# Patient Record
Sex: Male | Born: 1955
Health system: Southern US, Community
[De-identification: ages and names within clinical notes are randomized; demographics above are authoritative.]

## PROBLEM LIST (undated history)

## (undated) DIAGNOSIS — M1990 Unspecified osteoarthritis, unspecified site: Secondary | ICD-10-CM

## (undated) DIAGNOSIS — I509 Heart failure, unspecified: Secondary | ICD-10-CM

## (undated) HISTORY — PX: TONSILLECTOMY: SUR1361

## (undated) HISTORY — PX: UPPER GASTROINTESTINAL ENDOSCOPY: SHX188

---

## 2000-01-21 HISTORY — PX: COLONOSCOPY: SHX174

## 2000-08-21 ENCOUNTER — Ambulatory Visit (HOSPITAL_COMMUNITY): Admission: RE | Admit: 2000-08-21 | Discharge: 2000-08-21 | Payer: Self-pay | Admitting: Internal Medicine

## 2000-12-22 ENCOUNTER — Emergency Department (HOSPITAL_COMMUNITY): Admission: EM | Admit: 2000-12-22 | Discharge: 2000-12-22 | Payer: Self-pay | Admitting: Emergency Medicine

## 2000-12-22 ENCOUNTER — Encounter: Payer: Self-pay | Admitting: Emergency Medicine

## 2001-05-26 ENCOUNTER — Emergency Department (HOSPITAL_COMMUNITY): Admission: EM | Admit: 2001-05-26 | Discharge: 2001-05-27 | Payer: Self-pay | Admitting: Emergency Medicine

## 2001-05-27 ENCOUNTER — Encounter: Payer: Self-pay | Admitting: Emergency Medicine

## 2002-12-09 ENCOUNTER — Emergency Department (HOSPITAL_COMMUNITY): Admission: EM | Admit: 2002-12-09 | Discharge: 2002-12-09 | Payer: Self-pay | Admitting: Emergency Medicine

## 2003-08-10 ENCOUNTER — Emergency Department (HOSPITAL_COMMUNITY): Admission: EM | Admit: 2003-08-10 | Discharge: 2003-08-11 | Payer: Self-pay | Admitting: Emergency Medicine

## 2003-12-11 ENCOUNTER — Emergency Department (HOSPITAL_COMMUNITY): Admission: EM | Admit: 2003-12-11 | Discharge: 2003-12-11 | Payer: Self-pay | Admitting: Emergency Medicine

## 2004-11-01 ENCOUNTER — Ambulatory Visit (HOSPITAL_COMMUNITY): Admission: RE | Admit: 2004-11-01 | Discharge: 2004-11-01 | Payer: Self-pay | Admitting: Family Medicine

## 2008-11-20 ENCOUNTER — Ambulatory Visit (HOSPITAL_COMMUNITY): Admission: RE | Admit: 2008-11-20 | Discharge: 2008-11-20 | Payer: Self-pay | Admitting: Family Medicine

## 2009-10-23 ENCOUNTER — Ambulatory Visit (HOSPITAL_COMMUNITY): Admission: RE | Admit: 2009-10-23 | Discharge: 2009-10-23 | Payer: Self-pay | Admitting: Internal Medicine

## 2009-10-26 ENCOUNTER — Ambulatory Visit (HOSPITAL_COMMUNITY)
Admission: RE | Admit: 2009-10-26 | Discharge: 2009-10-26 | Payer: Self-pay | Source: Home / Self Care | Admitting: Internal Medicine

## 2009-11-12 ENCOUNTER — Ambulatory Visit (HOSPITAL_COMMUNITY): Admission: RE | Admit: 2009-11-12 | Discharge: 2009-11-12 | Payer: Self-pay | Admitting: Family Medicine

## 2010-04-25 ENCOUNTER — Other Ambulatory Visit (HOSPITAL_COMMUNITY): Payer: Self-pay | Admitting: Family Medicine

## 2010-04-25 ENCOUNTER — Encounter (HOSPITAL_COMMUNITY): Payer: Self-pay

## 2010-04-25 ENCOUNTER — Ambulatory Visit (HOSPITAL_COMMUNITY)
Admission: RE | Admit: 2010-04-25 | Discharge: 2010-04-25 | Disposition: A | Discharge status: Home / Self Care | Payer: BC Managed Care – PPO | Source: Ambulatory Visit | Attending: Family Medicine | Admitting: Family Medicine

## 2010-04-25 DIAGNOSIS — R059 Cough, unspecified: Secondary | ICD-10-CM | POA: Insufficient documentation

## 2010-04-25 DIAGNOSIS — R05 Cough: Secondary | ICD-10-CM | POA: Insufficient documentation

## 2010-04-25 DIAGNOSIS — R1013 Epigastric pain: Secondary | ICD-10-CM

## 2010-04-25 DIAGNOSIS — R079 Chest pain, unspecified: Secondary | ICD-10-CM

## 2010-04-25 DIAGNOSIS — K219 Gastro-esophageal reflux disease without esophagitis: Secondary | ICD-10-CM

## 2010-04-25 DIAGNOSIS — F17200 Nicotine dependence, unspecified, uncomplicated: Secondary | ICD-10-CM | POA: Insufficient documentation

## 2010-05-06 ENCOUNTER — Ambulatory Visit (INDEPENDENT_AMBULATORY_CARE_PROVIDER_SITE_OTHER): Payer: BC Managed Care – PPO | Admitting: Gastroenterology

## 2010-05-06 ENCOUNTER — Encounter: Payer: Self-pay | Admitting: Gastroenterology

## 2010-05-06 VITALS — BP 124/88 | HR 75 | Temp 98.7°F | Ht 68.0 in | Wt 110.2 lb

## 2010-05-06 DIAGNOSIS — K5900 Constipation, unspecified: Secondary | ICD-10-CM | POA: Insufficient documentation

## 2010-05-06 DIAGNOSIS — A048 Other specified bacterial intestinal infections: Secondary | ICD-10-CM

## 2010-05-06 DIAGNOSIS — K625 Hemorrhage of anus and rectum: Secondary | ICD-10-CM

## 2010-05-06 DIAGNOSIS — K623 Rectal prolapse: Secondary | ICD-10-CM

## 2010-05-06 DIAGNOSIS — R1013 Epigastric pain: Secondary | ICD-10-CM | POA: Insufficient documentation

## 2010-05-06 DIAGNOSIS — R935 Abnormal findings on diagnostic imaging of other abdominal regions, including retroperitoneum: Secondary | ICD-10-CM | POA: Insufficient documentation

## 2010-05-06 MED ORDER — PEG 3350-KCL-NA BICARB-NACL 420 G PO SOLR: ORAL | Status: AC

## 2010-05-06 NOTE — Assessment & Plan Note (Signed)
Complete triple drug therapy. EGD is planned.

## 2010-05-06 NOTE — Assessment & Plan Note (Signed)
An abdominal ultrasound 10/11 which showed 3 mm diameter nonshadowing echogenic focus of proximal tail. F/U CT A/P showed normal pancreas but radiologist recommend f/u abd u/s in six months. Will go ahead and arrange for that as well.

## 2010-05-06 NOTE — Assessment & Plan Note (Signed)
Patient reports trying multiple stool softeners in the past and states his rectal pain persists no matter what the consistency of the stool. Encouraged him to try MiraLax 17 g daily as needed. Colonoscopy is planned in the near future

## 2010-05-06 NOTE — Assessment & Plan Note (Signed)
Recent acute onset epigastric pain. Worse with meals. Some improvement since H. pylori treatment started. No prior EGD. EGD in near future to r/o PUD.  I have discussed the risks, alternatives, benefits with regards to but not limited to the risk of reaction to medication, bleeding, infection, perforation and the patient is agreeable to proceed. Written consent to be obtained.

## 2010-05-06 NOTE — Assessment & Plan Note (Addendum)
Intermittent toilet tissue hematochezia. He gives a history of a "rectal muscle tear" but by history may have a rectal prolapse. Remote colonoscopy more than 10 years ago showed internal hemorrhoids. Evaluate hematochezia, rectal pain with colonoscopy in the near future. I have discussed the risks, alternatives, benefits with regards to but not limited to the risk of reaction to medication, bleeding, infection, perforation and the patient is agreeable to proceed. Written consent to be obtained.

## 2010-05-06 NOTE — Progress Notes (Signed)
Primary Care Physician:  Colette Ribas, MD  Primary Gastroenterologist:  Dr. Roetta Sessions  Chief Complaint  Patient presents with  . Abdominal Pain    PUD, EGD, Colonoscopy    HPI:  Robert Duarte is a 55 y.o. male here for further evaluation of abdominal pain. Seen by Dr. Assunta Found on 04/25/2010 for 1 week history of epigastric pain at that time. He states the pain was so severe he thought he broke a rib. He notes a lot of coughing around that time. He describes the pain as burning and stabbing like. Doubled over with it. Worse with food. Never had this before. H. pylori serologies were positive. He is currently on treatment. No odynophagia. Solid food dysphagia at times. No heartburn. Vomiting couple of times. Torn rectal muscle (?rectal prolapse), seen Dr. Lovell Sheehan, Dr. Malvin Johns, will not do surgery. Brown anal drainage, wears pads. Rectal pain with BMs. BM once weekly. TCS 10 years ago by Dr. Jena Gauss, internal hemorrhoids. No EGD. Some brbpr on toilet tissue.    Current Outpatient Prescriptions  Medication Sig Dispense Refill  . clarithromycin (BIAXIN XL) 500 MG 24 hr tablet Take 1 tablet by mouth Twice daily.      . metroNIDAZOLE (FLAGYL) 500 MG tablet Take 1 tablet by mouth Three times a day.      Marland Kitchen omeprazole (PRILOSEC) 40 MG capsule Take 1 tablet by mouth daily.        Allergies as of 05/06/2010 - Review Complete 05/06/2010  Allergen Reaction Noted  . Penicillins  04/25/2010    Past Medical History  Diagnosis Date  . Emphysema    Past Surgical History  Procedure Date  . Tonsillectomy   . Colonoscopy 2002    internal hemorrhoids      Family History  Problem Relation Age of Onset  . Lung cancer Father     deceased  . Heart disease Mother     heart valve replacement  . Colon cancer Neg Hx   . Liver disease Neg Hx     History   Social History  . Marital Status: Married    Spouse Name: N/A    Number of Children: N/A  . Years of Education: N/A    Occupational History  . [employer]/37 years   Social History Main Topics  . Smoking status: Current Everyday Smoker -- 1.0 packs/day for 29 years    Types: Cigarettes  . Smokeless tobacco: Never Used  . Alcohol Use: No  . Drug Use: No  . Sexually Active: Not Currently      ROS:  General: Negative for anorexia, weight loss, fever, chills, fatigue, weakness. Eyes: Negative for vision changes.  ENT: Negative for hoarseness, difficulty swallowing , nasal congestion. CV: Negative for chest pain, angina, palpitations, dyspnea on exertion, peripheral edema.  Respiratory: Negative for dyspnea at rest, dyspnea on exertion, cough, sputum, wheezing.  GI: See history of present illness. GU:  Negative for dysuria, hematuria, urinary incontinence, urinary frequency, nocturnal urination.  MS: Negative for joint pain, low back pain.  Derm: Negative for rash or itching.  Neuro: Negative for weakness, abnormal sensation, seizure, frequent headaches, memory loss, confusion.  Psych: Negative for anxiety, depression, suicidal ideation, hallucinations.  Endo: Negative for unusual weight change.  Heme: Negative for bruising or bleeding. Allergy: Negative for rash or hives.    Physical Examination:  BP 124/88  Pulse 75  Temp 98.7 F (37.1 C)  Ht 5\' 8"  (1.727 m)  Wt  110 lb 3.2 oz (49.986 kg)  BMI 16.76 kg/m2   General: Well-nourished, well-developed in no acute distress.  Head: Normocephalic, atraumatic.   Eyes: Conjunctiva pink, no icterus. Mouth: Oropharyngeal mucosa moist and pink , no lesions erythema or exudate. Neck: Supple without thyromegaly, masses, or lymphadenopathy.  Lungs: Clear to auscultation bilaterally.  Heart: Regular rate and rhythm, no murmurs rubs or gallops.  Abdomen: Bowel sounds are normal, mild epigastric tenderness, nondistended, no hepatosplenomegaly or masses, no abdominal bruits or    hernia , no rebound or guarding.   Extremities: No  lower extremity edema.  Neuro: Alert and oriented x 4 , grossly normal neurologically.  Skin: Warm and dry, no rash or jaundice.   Psych: Alert and cooperative, normal mood and affect.

## 2010-05-09 ENCOUNTER — Ambulatory Visit (HOSPITAL_COMMUNITY)
Admission: RE | Admit: 2010-05-09 | Discharge: 2010-05-09 | Disposition: A | Discharge status: Home / Self Care | Payer: BC Managed Care – PPO | Source: Ambulatory Visit | Attending: Gastroenterology | Admitting: Gastroenterology

## 2010-05-09 DIAGNOSIS — R935 Abnormal findings on diagnostic imaging of other abdominal regions, including retroperitoneum: Secondary | ICD-10-CM

## 2010-05-09 DIAGNOSIS — K869 Disease of pancreas, unspecified: Secondary | ICD-10-CM | POA: Insufficient documentation

## 2010-05-09 DIAGNOSIS — R1013 Epigastric pain: Secondary | ICD-10-CM

## 2010-05-27 ENCOUNTER — Ambulatory Visit (HOSPITAL_COMMUNITY)
Admission: RE | Admit: 2010-05-27 | Discharge: 2010-05-27 | Disposition: A | Discharge status: Home / Self Care | Payer: BC Managed Care – PPO | Source: Ambulatory Visit | Attending: Internal Medicine | Admitting: Internal Medicine

## 2010-05-27 ENCOUNTER — Encounter: Payer: BC Managed Care – PPO | Admitting: Internal Medicine

## 2010-05-27 ENCOUNTER — Other Ambulatory Visit: Payer: Self-pay | Admitting: Internal Medicine

## 2010-05-27 DIAGNOSIS — D129 Benign neoplasm of anus and anal canal: Secondary | ICD-10-CM | POA: Insufficient documentation

## 2010-05-27 DIAGNOSIS — D128 Benign neoplasm of rectum: Secondary | ICD-10-CM | POA: Insufficient documentation

## 2010-05-27 DIAGNOSIS — R1310 Dysphagia, unspecified: Secondary | ICD-10-CM

## 2010-05-27 DIAGNOSIS — K222 Esophageal obstruction: Secondary | ICD-10-CM | POA: Insufficient documentation

## 2010-05-27 DIAGNOSIS — J449 Chronic obstructive pulmonary disease, unspecified: Secondary | ICD-10-CM | POA: Insufficient documentation

## 2010-05-27 DIAGNOSIS — K210 Gastro-esophageal reflux disease with esophagitis: Secondary | ICD-10-CM

## 2010-05-27 DIAGNOSIS — R1013 Epigastric pain: Secondary | ICD-10-CM

## 2010-05-27 DIAGNOSIS — K921 Melena: Secondary | ICD-10-CM

## 2010-05-27 DIAGNOSIS — R17 Unspecified jaundice: Secondary | ICD-10-CM | POA: Insufficient documentation

## 2010-05-27 DIAGNOSIS — J4489 Other specified chronic obstructive pulmonary disease: Secondary | ICD-10-CM | POA: Insufficient documentation

## 2010-05-27 DIAGNOSIS — K5909 Other constipation: Secondary | ICD-10-CM | POA: Insufficient documentation

## 2010-05-28 NOTE — Progress Notes (Signed)
Reminder in epic for repeat abd ultrasound in 6 months

## 2010-05-28 NOTE — Op Note (Signed)
NAME:  Robert Duarte, SANGUINETTI             ACCOUNT NO.:  [id number]  MEDICAL RECORD NO.:  [id number]           PATIENT TYPE:  O  LOCATION:  [facility]                          FACILITY:  [facility]  PHYSICIAN:  R. Roetta Sessions, M.D. DATE OF BIRTH:  October 11, 1955  DATE OF PROCEDURE:  05/27/2010 DATE OF DISCHARGE:                              OPERATIVE REPORT   PROCEDURE:  EGD with Elease Hashimoto dilation followed by ileocolonoscopy with biopsy.  INDICATIONS FOR PROCEDURE:  A 55 year old gentleman with 1-week history of epigastric pain in background some reflux symptoms, intermittent esophageal dysphagia to solids, saw Dr. Phillips Odor.  H. pylori serologies were positive in the initial workup.  He is completing H. pylori therapy with recurrent therapy at this time.  He was seen in office on May 06, 2010.  Since the office visit, he tells me he is much better, he continues to have esophageal dysphagia, but he has no abdominal pain. He is now being jaundiced.  No melena, hematochezia, nausea, or vomiting.  He does have intermittent hematochezia.  Last colonoscopy was 10 years ago, chronic constipation.  This colonoscopy now being done.  Risks, benefits, limitations, alternatives, imponderables have been discussed, questions answered. Please see the documentation in the medical record.  PROCEDURE NOTE:  O2 saturation, blood pressure, pulse, respirations were monitored throughout the entirety of both procedures.  CONSCIOUS SEDATION:  Versed 4 mg IV, Demerol 75 mg IV in divided doses.  INSTRUMENT:  Pentax video chip system.  Cetacaine spray topical pharyngeal anesthesia.  EGD FINDINGS:  Examination of the tubular esophagus revealed a noncritical-appearing Schatzki's ring and tiny circumferential esophageal erosions straddling the GE junction.  There was no Barrett's esophagus or tumor.  EG junction was easily traversed.  Stomach:  Gastric cavity was emptied and insufflated well with air. Thorough examination  of gastric mucosa including retroflexion of proximal stomach, esophagogastric junction demonstrated a small hiatal hernia only.  Pylorus was patent, easily traversed.  Examination of the bulb and second portion revealed no abnormalities.  THERAPEUTIC/DIAGNOSTIC MANEUVERS PERFORMED:  Scope was withdrawn.  A 54- French Maloney dilator was passed to full insertion with ease.  A look back revealed superficial tear at the GE junction, nice dilation of the ring without apparent complication.  The patient tolerated the procedure well, was prepared for colonoscopy.  Digital rectal exam revealed mucosal prolapse approximately 1-1.5 cm circumferential, easily reducible, otherwise negative.  Endoscopic findings:  Prep was excellent.  Colon:  Colonic mucosa was surveyed from the rectosigmoid junction through the left transverse right colon to the appendiceal orifice, ileocecal valve/cecum.  These structures were well seen and photographed for record.  From this level, scope was slowly withdrawn. All previously mentioned mucosal surfaces were again seen.  After the terminal ileum was intubated to 10 cm, the colonic mucosa appeared normal except for diminutive rectosigmoid polyp which was cold biopsied/removed.  Terminal ileal mucosa appeared normal.  Scope was pulled down to the rectum where a thorough examination of the rectal mucosa including retroflexed view of the anal verge demonstrated some redundant rectal tissue bivalving through the anal canal, did not see any significant hemorrhoids.  No evidence of neoplasm  or polyp.  The patient tolerated both procedures well.  Cecal withdrawal time 7 minutes.  IMPRESSION: 1. Circumferential distal esophageal erosions consistent with mild     erosive reflux esophagitis. 2. Schatzki's ring status post dilation as described above, small     hiatal hernia, otherwise normal stomach, D1 and D2.  COLONOSCOPY FINDINGS:  Rectal prolapse as described above.  A  single diminutive rectosigmoid polyp status post cold biopsy removal, otherwise remainder of the rectum, colon, terminal ileum appeared to be normal.  RECOMMENDATIONS: 1. Once H. pylori treatment is completed, continue omeprazole 40 mg     orally daily thereafter.  Literature on reflux and hiatal hernia     provided to the patient. 2. A 10-day course of Canasa, 1 g mesalamine suppositories one per     rectum at bedtime. 3. Daily fiber such as Metamucil or Citrucel. 4. MiraLax 17 g orally bedtime p.r.n. constipation. 5. Further recommendations to follow, pending review on path.     Jonathon Bellows, M.D.     RMR/MEDQ  D:  05/27/2010  T:  05/27/2010  Job:  [id number]  cc:   Dr. Phillips Odor  Electronically Signed by Lorrin Goodell M.D. on 05/28/2010 10:53:24 AM

## 2010-05-29 ENCOUNTER — Encounter: Payer: Self-pay | Admitting: Internal Medicine

## 2010-05-30 ENCOUNTER — Encounter: Payer: Self-pay | Admitting: Internal Medicine

## 2010-06-09 ENCOUNTER — Emergency Department (HOSPITAL_COMMUNITY)
Admission: EM | Admit: 2010-06-09 | Discharge: 2010-06-09 | Disposition: A | Discharge status: Home / Self Care | Payer: BC Managed Care – PPO | Attending: Emergency Medicine | Admitting: Emergency Medicine

## 2010-06-09 ENCOUNTER — Emergency Department (HOSPITAL_COMMUNITY): Payer: BC Managed Care – PPO

## 2010-06-09 DIAGNOSIS — J449 Chronic obstructive pulmonary disease, unspecified: Secondary | ICD-10-CM | POA: Insufficient documentation

## 2010-06-09 DIAGNOSIS — J4489 Other specified chronic obstructive pulmonary disease: Secondary | ICD-10-CM | POA: Insufficient documentation

## 2010-06-09 DIAGNOSIS — R0789 Other chest pain: Secondary | ICD-10-CM | POA: Insufficient documentation

## 2010-06-09 DIAGNOSIS — M25519 Pain in unspecified shoulder: Secondary | ICD-10-CM | POA: Insufficient documentation

## 2010-06-09 LAB — BASIC METABOLIC PANEL
BUN: 10 mg/dL (ref 6–23)
CO2: 29 mEq/L (ref 19–32)
Calcium: 9.2 mg/dL (ref 8.4–10.5)
Chloride: 102 mEq/L (ref 96–112)
Creatinine, Ser: 0.9 mg/dL (ref 0.4–1.5)
GFR calc Af Amer: >60 mL/min (ref >60)
GFR calc non Af Amer: >60 mL/min (ref >60)
Glucose, Bld: 109 mg/dL — ABNORMAL HIGH (ref 70–99)
Potassium: 3.7 mEq/L (ref 3.5–5.1)
Sodium: 136 mEq/L (ref 135–145)

## 2010-06-09 LAB — DIFFERENTIAL
Basophils Absolute: 0.1 10*3/uL (ref 0.0–0.1)
Basophils Relative: 1 % (ref 0–1)
Eosinophils Absolute: 0.1 10*3/uL (ref 0.0–0.7)
Eosinophils Relative: 1 % (ref 0–5)
Lymphocytes Relative: 17 % (ref 12–46)
Lymphs Abs: 1.3 10*3/uL (ref 0.7–4.0)
Monocytes Absolute: 0.7 10*3/uL (ref 0.1–1.0)
Monocytes Relative: 9 % (ref 3–12)
Neutro Abs: 5.5 10*3/uL (ref 1.7–7.7)
Neutrophils Relative %: 72 % (ref 43–77)

## 2010-06-09 LAB — POCT CARDIAC MARKERS
CKMB, poc: <1 ng/mL — ABNORMAL LOW (ref 1.0–8.0)
CKMB, poc: <1 ng/mL — ABNORMAL LOW (ref 1.0–8.0)
Myoglobin, poc: 44.8 ng/mL (ref 12–200)
Myoglobin, poc: 49 ng/mL (ref 12–200)
Troponin i, poc: <0.05 ng/mL (ref 0.00–0.09)
Troponin i, poc: <0.05 ng/mL (ref 0.00–0.09)

## 2010-06-09 LAB — CBC
HCT: 38.5 % — ABNORMAL LOW (ref 39.0–52.0)
Hemoglobin: 13 g/dL (ref 13.0–17.0)
MCH: 30.4 pg (ref 26.0–34.0)
MCHC: 33.8 g/dL (ref 30.0–36.0)
MCV: 90.2 fL (ref 78.0–100.0)
Platelets: 121 10*3/uL — ABNORMAL LOW (ref 150–400)
RBC: 4.27 MIL/uL (ref 4.22–5.81)
RDW: 13.1 % (ref 11.5–15.5)
WBC: 7.6 10*3/uL (ref 4.0–10.5)

## 2010-09-22 ENCOUNTER — Emergency Department (HOSPITAL_COMMUNITY): Payer: BC Managed Care – PPO

## 2010-09-22 ENCOUNTER — Emergency Department (HOSPITAL_COMMUNITY)
Admission: EM | Admit: 2010-09-22 | Discharge: 2010-09-22 | Disposition: A | Discharge status: Home / Self Care | Payer: BC Managed Care – PPO | Attending: Emergency Medicine | Admitting: Emergency Medicine

## 2010-09-22 ENCOUNTER — Encounter (HOSPITAL_COMMUNITY): Payer: Self-pay | Admitting: *Deleted

## 2010-09-22 DIAGNOSIS — F17200 Nicotine dependence, unspecified, uncomplicated: Secondary | ICD-10-CM | POA: Insufficient documentation

## 2010-09-22 DIAGNOSIS — M67919 Unspecified disorder of synovium and tendon, unspecified shoulder: Secondary | ICD-10-CM

## 2010-09-22 DIAGNOSIS — J438 Other emphysema: Secondary | ICD-10-CM | POA: Insufficient documentation

## 2010-09-22 DIAGNOSIS — M25519 Pain in unspecified shoulder: Secondary | ICD-10-CM | POA: Insufficient documentation

## 2010-09-22 MED ORDER — HYDROCODONE-ACETAMINOPHEN 5-325 MG PO TABS: 1.0000 | ORAL_TABLET | ORAL | Status: AC | PRN

## 2010-09-22 MED ORDER — HYDROCODONE-ACETAMINOPHEN 5-325 MG PO TABS
1.0000 | ORAL_TABLET | Freq: Once | ORAL | Status: AC
  Administered 2010-09-22: 1 via ORAL
  Filled 2010-09-22: qty 1

## 2010-09-22 MED ORDER — IBUPROFEN 800 MG PO TABS
800.0000 mg | ORAL_TABLET | Freq: Once | ORAL | Status: AC
  Administered 2010-09-22: 800 mg via ORAL
  Filled 2010-09-22: qty 1

## 2010-09-22 MED ORDER — IBUPROFEN 800 MG PO TABS: 800.0000 mg | ORAL_TABLET | Freq: Three times a day (TID) | ORAL | Status: AC | PRN

## 2010-09-22 NOTE — ED Provider Notes (Signed)
History     CSN: [id number] Arrival date & time: 09/22/2010 10:41 AM  Chief Complaint  Patient presents with  . Shoulder Pain   HPI Comments: Patient works a job that required repetitive movements and lifting with both arms.  He denies injury.  He does have a history of left rotator cuff injury years ago, which this remind him of the symptoms.  Patient is a 55 y.o. male presenting with shoulder pain. The history is provided by the patient.  Shoulder Pain This is a new problem. The current episode started 1 to 4 weeks ago. The problem occurs constantly. The problem has been gradually worsening. Associated symptoms include arthralgias. Pertinent negatives include no abdominal pain, chest pain, chills, congestion, diaphoresis, fever, headaches, joint swelling, nausea, neck pain, numbness, rash, sore throat or weakness. Exacerbated by: Worse with certain positions and with palpation,  lifting. He has tried ice for the symptoms. The treatment provided no relief.    Past Medical History  Diagnosis Date  . Emphysema     Past Surgical History  Procedure Date  . Tonsillectomy   . Colonoscopy 2002    internal hemorrhoids    Family History  Problem Relation Age of Onset  . Lung cancer Father     deceased  . Heart disease Mother     heart valve replacement  . Colon cancer Neg Hx   . Liver disease Neg Hx     History  Substance Use Topics  . Smoking status: Current Everyday Smoker -- 1.0 packs/day for 29 years    Types: Cigarettes  . Smokeless tobacco: Never Used  . Alcohol Use: No      Review of Systems  Constitutional: Negative for fever, chills and diaphoresis.  HENT: Negative for congestion, sore throat and neck pain.   Eyes: Negative.   Respiratory: Negative for chest tightness and shortness of breath.   Cardiovascular: Negative for chest pain and leg swelling.  Gastrointestinal: Negative for nausea and abdominal pain.  Genitourinary: Negative.   Musculoskeletal: Positive  for arthralgias. Negative for joint swelling.  Skin: Negative.  Negative for rash and wound.  Neurological: Negative for dizziness, weakness, light-headedness, numbness and headaches.  Hematological: Negative.   Psychiatric/Behavioral: Negative.     Physical Exam  BP 131/82  Pulse 62  Temp(Src) 97.9 F (36.6 C) (Oral)  Resp 16  Ht 5\' 8"  (1.727 m)  Wt 110 lb (49.896 kg)  BMI 16.73 kg/m2  SpO2 99%  Physical Exam  Nursing note and vitals reviewed. Constitutional: He is oriented to person, place, and time. He appears well-developed and well-nourished.  HENT:  Head: Normocephalic and atraumatic.  Eyes: Conjunctivae are normal.  Neck: Normal range of motion.  Cardiovascular: Normal rate, regular rhythm, normal heart sounds and intact distal pulses.   Pulmonary/Chest: Effort normal and breath sounds normal. He has no wheezes.  Abdominal: Soft. Bowel sounds are normal. There is no tenderness.  Musculoskeletal: He exhibits tenderness. He exhibits no edema.       Right shoulder: He exhibits tenderness, bony tenderness and pain. He exhibits no swelling, no effusion and no crepitus.       Arms:      Pain worsens with attempts to lift arm over shoulder height and with attempts to abduct his outstretched right arm.  Equal grip strength.  Radial pulses equal bilateral,  No decreased sensation distal to site of pain.  Neurological: He is alert and oriented to person, place, and time.  Skin: Skin is warm and dry.  Psychiatric: He has a normal mood and affect.    ED Course  Procedures  MDM Exam most consistent with rotator cuff injury/sprain/strain.   Dg Shoulder Right  09/22/2010  *RADIOLOGY REPORT*  Clinical Data: Right shoulder pain.  RIGHT SHOULDER - 2+ VIEW  Comparison: None  Findings: There is no evidence of acute bony abnormality. There is no evidence of acute fracture, subluxation, or dislocation. No focal bony lesions are identified. The visualized right hemithorax is unremarkable.   IMPRESSION: Unremarkable shoulder.  Original Report Authenticated By: Rosendo Gros, M.D.          Candis Musa, PA 09/22/10 1659

## 2010-09-22 NOTE — ED Notes (Signed)
Pt c/o pain in his right shoulder x 1 week. Pt denies injury.

## 2010-10-01 NOTE — ED Provider Notes (Signed)
Medical screening examination/treatment/procedure(s) were performed by non-physician practitioner and as supervising physician I was immediately available for consultation/collaboration.   Klint Lezcano W. Anel Creighton, MD 10/01/10 0538 

## 2010-10-03 ENCOUNTER — Ambulatory Visit (INDEPENDENT_AMBULATORY_CARE_PROVIDER_SITE_OTHER): Payer: BC Managed Care – PPO | Admitting: Orthopedic Surgery

## 2010-10-03 ENCOUNTER — Encounter: Payer: Self-pay | Admitting: Orthopedic Surgery

## 2010-10-03 VITALS — BP 116/80 | Ht 68.0 in | Wt 110.0 lb

## 2010-10-03 DIAGNOSIS — M719 Bursopathy, unspecified: Secondary | ICD-10-CM

## 2010-10-03 DIAGNOSIS — M7551 Bursitis of right shoulder: Secondary | ICD-10-CM

## 2010-10-03 MED ORDER — HYDROCODONE-ACETAMINOPHEN 5-325 MG PO TABS: 1.0000 | ORAL_TABLET | Freq: Four times a day (QID) | ORAL | Status: AC | PRN

## 2010-10-03 MED ORDER — IBUPROFEN 800 MG PO TABS: 800.0000 mg | ORAL_TABLET | Freq: Three times a day (TID) | ORAL | Status: AC | PRN

## 2010-10-03 MED ORDER — METHYLPREDNISOLONE ACETATE 40 MG/ML IJ SUSP: 40.0000 mg | Freq: Once | INTRAMUSCULAR | Status: DC

## 2010-10-03 NOTE — Progress Notes (Signed)
Chief complaint: RIGHT shoulder stiffness HPI:(78) 55 year old male complains of inability to raise his arm high.  He has pain over the RIGHT shoulder.  The pain started a few months ago.  The pain started suddenly with no injury.  No previous treatment.  He did go to the emergency room x-rays were negative.  Symptoms include throbbing stabbing burning pain which is 10 out of 10. Pain is constant, all the time. Nothing is made better Lifting and pulling makes it worse Associated symptoms swelling  ROS:(2) Review of systems fatigue, shortness of breath and cough.  Joint pain and stiffness.  Denies numbness tingling.  PFSH: (1)  Past Medical History  Diagnosis Date  . Emphysema    Past Surgical History  Procedure Date  . Tonsillectomy   . Colonoscopy 2002    internal hemorrhoids     Physical Exam(12) GENERAL: normal development   CDV: pulses are normal   Skin: normal  Lymph: nodes were not palpable/normal  Psychiatric: awake, alert and oriented  Neuro: normal sensation  MSK Ambulation is normal  LEFT shoulder no tenderness or swelling, range of motion is normal.  Strength is grade 5.  Shoulder is stable.  RIGHT shoulder is tender at the anterolateral acromial edge including the deltoid.  His external rotation is limited to 10 passive and active.  His abduction is limited to 70 active and passive.  Forward elevation 80 active and passive.  Internal and external rotation strength were grade 5.  Inferior subluxation test was negative for instability.  Imaging: Hospital x-rays were reviewed with the report were negative he has a type II acromion  Assessment: Rotator cuff bursitis which led to adhesive capsulitis    Plan: Subacromial injection, physical therapy, anti-inflammatories, and Norco for pain.  The patient is continuing to work through this.  Come back in 3 months.  Shoulder Injection Procedure Note   Pre-operative Diagnosis: right  RC  Syndrome  Post-operative Diagnosis: same  Indications: pain   Anesthesia: ethyl chloride   Procedure Details   Verbal consent was obtained for the procedure. The shoulder was prepped withalcohol and the skin was anesthetized. A 20 gauge needle was advanced into the subacromial space through posterior approach without difficulty  The space was then injected with 3 ml 1% lidocaine and 1 ml of depomedrol. The injection site was cleansed with isopropyl alcohol and a dressing was applied.  Complications:  None; patient tolerated the procedure well.        Shoulder Injection Procedure Note   Pre-operative Diagnosis: right  RC Syndrome  Post-operative Diagnosis: same  Indications: pain   Anesthesia: ethyl chloride   Procedure Details   Verbal consent was obtained for the procedure. The shoulder was prepped withalcohol and the skin was anesthetized. A 20 gauge needle was advanced into the subacromial space through posterior approach without difficulty  The space was then injected with 3 ml 1% lidocaine and 1 ml of depomedrol. The injection site was cleansed with isopropyl alcohol and a dressing was applied.  Complications:  None; patient tolerated the procedure well.

## 2010-10-03 NOTE — Patient Instructions (Addendum)
You have received a steroid shot. 15% of patients experience increased pain at the injection site with in the next 24 hours. This is best treated with ice and tylenol extra strength 2 tabs every 8 hours. If you are still having pain please call the office.   Take ibuprofen 3 x a day ( script sent to pharmacy)  Take norco q 6 hrs as needed for pain  Start physical therapy Adhesive Capsulitis Sometimes the shoulder becomes stiff and is painful to move. Some people say it feels as if the shoulder is frozen in place. Because of this, the condition is called "frozen shoulder." Its medical name is adhesive capsulitis.   The shoulder joint is made up of strong connective tissue that attaches the ball of the humerus to the shallow shoulder socket. This strong connective tissue is called the joint capsule. This tissue can become stiff and swollen. That is when adhesive capsulitis sets in. CAUSES It is not always clear just what the cause adhesive capsulitis. Possibilities include:  Injury to the shoulder joint.   Strain. This is a repetitive injury brought about by overuse.   Lack of use. Perhaps your arm or hand was otherwise injured. It might have been in a sling for awhile. Or perhaps you were not using it to avoid pain.   Referred pain. This is a sort of trick the body plays. You feel pain in the shoulder. But, the pain actually comes from an injury somewhere else in the body.   Long-standing health problems. Several diseases can cause adhesive capsulitis. They include diabetes, heart disease, stroke, thyroid problems, rheumatoid arthritis and lung disease.   Being a women older than 40. Anyone can develop adhesive capsulitis but it is most common in women in this age group.  SYMPTOMS  Pain.   It occurs when the arm is moved.   Parts of the shoulder might hurt if they are touched.   Pain is worse at night or when resting.   Soreness. It might not be strong enough to be called pain. But, the  shoulder aches.   The shoulder does not move freely.   Muscle spasms.   Trouble sleeping because of shoulder ache or pain.  DIAGNOSIS To decide if you have adhesive capsulitis, your healthcare provider will probably:  Ask about symptoms you have noticed.   Ask about your history of joint pain and anything that might have caused the pain.   Ask about your overall health.   Use hands to feel your shoulder and neck.   Ask you to move your shoulder in specific directions. This may indicate the origin of the pain.   Order imaging tests; pictures of the shoulder. They help pinpoint the source of the problem. An X-ray might be used. For more detail, an MRI is often used. An MRI details the tendons, muscles and ligaments as well as the joint.  TREATMENT Adhesive capsulitis can be treated several ways. Most treatments can be done in a clinic or in your healthcare provider's office. Be sure to discuss the different options with your caregiver. They include:  Physical therapy. You will work on specific exercises to get your shoulder moving again. The exercises usually involve stretching. A physical therapist (a caregiver with special training) can show you what to do and what not to do. The exercises will need to be done daily.   Medication.   Over-the-counter medicines may relieve pain and inflammation (the body's way of reacting to injury or infection).  Corticosteroids. These are stronger drugs to reduce pain and inflammation. They are given by injection (shots) into the shoulder joint. Frequent treatment is not recommended.   Muscle relaxants. Medication may be prescribed to ease muscle spasms.   Treatment of underlying conditions. This means treating another condition that is causing your shoulder problem. This might be a rotator cuff (tendon) problem   Shoulder manipulation. The shoulder will be moved by your healthcare provider. You would be under general anesthesia (given a drug  that puts you to sleep). You would not feel anything. Sometimes the joint will be injected with salt water (saline) at high pressure to break down internal scarring in the joint capsule.   Surgery. This is rarely needed. It may be suggested in advanced cases after all other treatment has failed.  PROGNOSIS In time, most people recover from adhesive capsulitis. Sometimes, however, the pain goes away but full movement of the shoulder does not return.   HOME CARE INSTRUCTIONS  Take any pain medications recommended by your healthcare provider. Follow the directions carefully.   If you have physical therapy, follow through with the therapist's suggestions. Be sure you understand the exercises you will be doing. You should understand:   How often the exercises should be done.   How many times each exercise should be repeated.   How long they should be done.   What other activities you should do, or not do.   That you should warm up before doing any exercise. Just 5 to 10 minutes will help. Small, gentle movements should get your shoulder ready for more.   Avoid high-demand exercise that involves your shoulder such as throwing. This type of exercise can make pain worse.   Consider using cold packs. Cold may ease swelling and pain. Ask your healthcare provider if a cold pack might help you. If so, get directions on how and when to use them.  SEEK MEDICAL CARE IF:  You have any questions about your medications.   Your pain continues to increase.  Document Released: 11/03/2008   [facility] Patient Information 2011 [place], [place].Impingement Syndrome   (Rotator Cuff Tendinitis, Bursitis) with Rehab   Impingement syndrome is a condition that involves inflammation of the tendons of the rotator cuff and the subacromial bursa, that causes pain in the shoulder. The rotator cuff consists of four tendons and muscles that control much of the shoulder and upper arm function. The subacromial bursa is a  fluid filled sac that helps reduce friction between the rotator cuff and one of the bones of the shoulder (acromion). Impingement syndrome is usually an overuse injury that causes  swelling of the bursa (bursitis), swelling of the tendon (tendonitis), and/or a tear of the tendon (strain). Strains are classified into three categories. Grade 1 strains cause pain, but the tendon is not lengthened. Grade 2 strains include a lengthened ligament, due to the ligament being stretched or partially ruptured. With grade 2 strains there is still function, although the function may be decreased. Grade 3 strains include a complete tear of the tendon or muscle, and function is usually impaired.   SYMPTOMS  Pain around the shoulder, often at the outer portion of the upper arm.  Pain that gets worse with shoulder function, especially when reaching overhead or lifting.  Sometimes, aching when not using the arm.  Pain that wakes you up at night.  Sometimes, tenderness, swelling, warmth, or redness over the affected area.  Loss of strength.  Limited motion of the shoulder, especially  reaching behind the back (to the back pocket or to unhook bra) or across your body.  Crackling sound (crepitation) when moving the arm.  Biceps tendon pain and inflammation (in the front of the shoulder). Worse when bending the elbow or lifting.   CAUSES   Impingement syndrome is often an overuse injury, in which chronic (repetitive) motions cause the tendons or bursa to become inflamed.  A strain occurs when a force is paced on the tendon or muscle that is greater than it can withstand. Common mechanisms of injury include: Stress from sudden increase in duration, frequency, or intensity of training.  Direct hit (trauma) to the shoulder.  Aging, erosion of the tendon with normal use.  Bony bump on shoulder (acromial spur).   RISK INCREASES WITH    Contact sports (football, wrestling, boxing).  Throwing sports (baseball,  tennis, volleyball).  Weightlifting and bodybuilding.  Heavy labor.  Previous injury to the rotator cuff, including impingement.  Poor shoulder strength and flexibility.  Failure to warm up properly before activity.  Inadequate protective equipment.  Old age.  Bony bump on shoulder (acromial spur).   PREVENTIVE MEASURES    Warm up and stretch properly before activity.  Allow for adequate recovery between workouts.  Maintain physical fitness: l Strength, flexibility, and endurance. l Cardiovascular fitness.  Learn and use proper exercise technique.   PROGNOSIS If treated properly, impingement syndrome usually goes away within 6 weeks. Sometimes surgery is required.     POSSIBLE COMPLICATIONS    Longer healing time if not properly treated, or if not given enough time to heal.  Recurring symptoms, that result in a chronic condition.  Shoulder stiffness, frozen shoulder, or loss of motion.  Rotator cuff tendon tear.  Recurring symptoms, especially if activity is resumed too soon, with overuse, with a direct blow, or when using poor technique.   GENERAL TREATMENT CONSIDERATIONS   Treatment first involves the use of ice and medicine, to reduce pain and inflammation. The use of strengthening and stretching exercises may help reduce pain with activity. These exercises may be performed at home or with a therapist. If non-surgical treatment is unsuccessful after more than 6 months, surgery may be advised. After surgery and rehabilitation, activity is usually possible in 3 months.     MEDICATION:    If pain medicine is needed, nonsteroidal anti-inflammatory medicines (aspirin and ibuprofen), or other minor pain relievers (acetaminophen), are often advised.    Do not take pain medicine for 7 days before surgery.    Prescription pain relievers may be given, if your caregiver thinks they are needed. Use only as directed and only as much as you need.  Corticosteroid  injections may be given by your caregiver.  These injections should be reserved for the most serious cases, because they may only be given a certain number of times.   HEAT AND COLD:    Cold treatment (icing) should be applied for 10 to 15 minutes every 2 to 3 hours for inflammation and pain, and immediately after activity that aggravates your symptoms. Use ice packs or an ice massage.  Heat treatment may be used before performing stretching and strengthening activities prescribed by your caregiver, physical therapist, or athletic trainer. Use a heat pack or a warm water soak.     SEEK MEDICAL CARE IF:    Symptoms get worse or do not improve in 4 to 6 weeks, despite treatment.  New, unexplained symptoms develop. (Drugs used in treatment may produce side effects.)  EXERCISES   RANGE OF MOTION AND STRETCHING EXERCISES - Impingement Syndrome (Rotator Cuff  Tendinitis, Bursitis) These exercises may help you when beginning to rehabilitate your injury. Your symptoms may go away with or without further involvement from your physician, physical therapist or athletic trainer. While completing these exercises, remember:    Restoring tissue flexibility helps normal motion to return to the joints. This allows healthier, less painful movement and activity.  An effective stretch should be held for at least 30 seconds.  A stretch should never be painful. You should only feel a gentle lengthening or release in the stretched tissue.       STRETCH - Flexion, Standing    Stand with good posture. With an underhand grip on your __________ hand, and an overhand grip on the opposite hand, grasp a broomstick or cane so that your hands are a little more than shoulder width apart.  Keeping your __________ elbow straight and shoulder muscles relaxed, push the stick with your opposite hand, to raise your __________ arm in front of your body and then overhead. Raise your arm until you feel a stretch in your  __________ shoulder, but before you have increased shoulder pain.  Try to avoid shrugging your __________ shoulder as your arm rises, by keeping your shoulder blade tucked down and toward your mid-back spine. Hold for __________ seconds.  Slowly return to the starting position.   Repeat __________ times. Complete this exercise __________ times per day.     STRETCH - Abduction, Supine    Lie on your back. With an underhand grip on your __________ hand and an overhand grip on the opposite hand, grasp a broomstick or cane so that your hands are a little more than shoulder width apart.  Keeping your __________ elbow straight and your shoulder muscles relaxed, push the stick with your opposite hand, to raise your __________ arm out to the side of your body and then overhead. Raise your arm until you feel a stretch in your __________ shoulder, but before you have increased shoulder pain.  Try to avoid shrugging your __________ shoulder as your arm rises, by keeping your shoulder blade tucked down and toward your mid-back spine. Hold for __________ seconds.  Slowly return to the starting position.   Repeat __________ times. Complete this exercise __________ times per day.       ROM - Flexion, Active-Assisted  Lie on your back. You may bend your knees for comfort.  Grasp a broomstick or cane so your hands are about shoulder width apart. Your __________ hand should grip the end of the stick, so that your hand is positioned "thumbs-up," as if you were about to shake hands.  Using your healthy arm to lead, raise your __________ arm overhead, until you feel a gentle stretch in your shoulder. Hold for __________ seconds.    Use the stick to assist in returning your __________ arm to its starting position. Repeat __________ times. Complete this exercise __________ times per day.       ROM - Internal Rotation, Supine     Lie on your back on a firm surface.  Place your __________ elbow about 60  degrees away from your side. Elevate your elbow with a folded towel, so that the elbow and shoulder are the same height.  Using a broomstick or cane and your strong arm, pull your __________ hand toward your body until you feel a gentle stretch, but no increase in your shoulder pain.  Keep your shoulder and  elbow in place throughout the exercise.  Hold for __________ seconds.  Slowly return to the starting position.   Repeat __________ times.  Complete this exercise __________ times per day.     STRETCH - Internal Rotation    Place your __________ hand behind your back, palm up.  Throw a towel or belt over your opposite shoulder. Grasp the towel with your __________ hand.  While keeping an upright posture, gently pull up on the towel, until you feel a stretch in the front of your __________ shoulder.  Avoid shrugging your __________ shoulder as your arm rises, by keeping your shoulder blade tucked down and toward your mid-back spine.    Hold for __________ seconds. Release the stretch, by lowering your healthy hand.   Repeat __________ times. Complete this exercise __________ times per day.       ROM - Internal Rotation     Using an underhand grip, grasp a stick behind your back with both hands.  While standing upright with good posture, slide the stick up your back until you feel a mild stretch in the front of your shoulder.  Hold for __________ seconds.  Slowly return to your starting position. Repeat __________ times. Complete this exercise __________ times per day.       STRETCH - Posterior Shoulder Capsule    Stand or sit with good posture.  Grasp your __________ elbow and draw it across your chest, keeping it at the same height as your shoulder.  Pull your elbow, so your upper arm comes in closer to your chest. Pull until you feel a gentle stretch in the back of your shoulder.  Hold for __________ seconds.   Repeat __________ times. Complete this exercise __________  times per day.     STRENGTHENING EXERCISES - Impingement Syndrome (Rotator Cuff Tendinitis, Bursitis) These exercises may help you when beginning to rehabilitate your injury. They may resolve your symptoms with or without further involvement from your physician, physical therapist or athletic trainer. While completing these exercises, remember:   Muscles can gain both the endurance and the strength needed for everyday activities through controlled exercises.  Complete these exercises as instructed by your physician, physical therapist or athletic trainer. Increase the resistance and repetitions only as guided.  You may experience muscle soreness or fatigue, but the pain or discomfort you are trying to eliminate should never worsen during these exercises. If this pain does get worse, stop and make sure you are following the directions exactly. If the pain is still present after adjustments, discontinue the exercise until you can discuss the trouble with your clinician.  During your recovery, avoid activity or exercises which involve actions that place your injured hand or elbow above your head or behind your back or head. These positions stress the tissues which you are trying to heal.    STRENGTH - Scapular Depression and Adduction   With good posture, sit on a firm chair. Support your arms in front of you, with pillows, arm rests, or on a table top. Have your elbows in line with the sides of your body.  Gently draw your shoulder blades down and toward your mid-back spine. Gradually increase the tension, without tensing the muscles along the top of your shoulders and the back of your neck.  Hold for __________ seconds. Slowly release the tension and relax your muscles completely before starting the next repetition.  After you have practiced this exercise, remove the arm support and complete the exercise in standing as  well as sitting position. Repeat __________ times. Complete this exercise  __________ times per day.       STRENGTH - Shoulder Abductors, Isometric    With good posture, stand or sit about 4-6 inches from a wall, with your __________ side facing the wall.    Bend your __________ elbow. Gently press your __________ elbow into the wall. Increase the pressure gradually, until you are pressing as hard as you can, without shrugging your shoulder or increasing any shoulder discomfort.  Hold for __________ seconds.  Release the tension slowly. Relax your shoulder muscles completely before you begin the next repetition.   Repeat __________ times. Complete this exercise __________ times per day.       STRENGTH - External Rotators, Isometric    Keep your __________ elbow at your side and bend it 90 degrees.  Step into a door frame so that the outside of your __________ wrist can press against the door frame without your upper arm leaving your side.  Gently press your __________ wrist into the door frame, as if you were trying to swing the back of your hand away from your stomach. Gradually increase the tension, until you are pressing as hard as you can, without shrugging your shoulder or increasing any shoulder discomfort.  Hold for __________ seconds.    Release the tension slowly. Relax your shoulder muscles completely before you begin the next repetition.   Repeat __________ times. Complete this exercise __________ times per day.       STRENGTH - Supraspinatus   Stand or sit with good posture. Grasp a __________ lb weight, or an exercise band or tubing, so that your hand is "thumbs-up," like you are shaking hands.  Slowly lift your __________ arm in a "V" away from your thigh, diagonally into the space between your side and straight ahead. Lift your hand to shoulder height or as far as you can, without increasing any shoulder pain.  At first, many people do not lift their hands above shoulder height.    Avoid shrugging your __________ shoulder as your arm rises,  by keeping your shoulder blade tucked down and toward your mid-back spine.    Hold for __________ seconds. Control the descent of your hand, as you slowly return to your starting position.   Repeat __________ times. Complete this exercise __________ times per day.       STRENGTH - External Rotators  Secure a rubber exercise band or tubing to a fixed object (table, pole) so that it is at the same height as your __________ elbow when you are standing or sitting on a firm surface.  Stand or sit so that the secured exercise band is at your uninjured side.    Bend your __________ elbow 90 degrees. Place a folded towel or small pillow under your __________ arm, so that your elbow is a few inches away from your side.  Keeping the tension on the exercise band, pull it away from your body, as if pivoting on your elbow. Be sure to keep your body steady, so that the movement is coming only from your rotating shoulder.  Hold for __________ seconds. Release the tension in a controlled manner, as you return to the starting position.   Repeat __________ times. Complete this exercise __________ times per day.       STRENGTH - Internal Rotators   Secure a rubber exercise band or tubing to a fixed object (table, pole) so that it is at the same height as  your __________ elbow when you are standing or sitting on a firm surface.  Stand or sit so that the secured exercise band is at your __________ side.    Bend your elbow 90 degrees.  Place a folded towel or small pillow under your __________ arm so that your elbow is a few inches away from your side.  Keeping the tension on the exercise band, pull it across your body, toward your stomach. Be sure to keep your body steady, so that the movement is coming only from your rotating shoulder.  Hold for __________ seconds.  Release the tension in a controlled manner, as you return to the starting position.   Repeat __________ times. Complete this exercise  __________ times per day.       STRENGTH - Scapular Protractors, Standing   Stand arms length away from a wall. Place your hands on the wall, keeping your elbows straight.  Begin by dropping your shoulder blades down and toward your mid-back spine.  To strengthen your protractors, keep your shoulder blades down, but slide them forward on your rib cage. It will feel as if you are lifting the back of your rib cage away from the wall. This is a subtle motion and can be challenging to complete. Ask your caregiver for further instruction, if you are not sure you are doing the exercise correctly.  Hold for __________ seconds. Slowly return to the starting position, resting the muscles completely before starting the next repetition. Repeat __________ times. Complete this exercise __________ times per day.     STRENGTH - Scapular Protractors, Supine  Lie on your back on a firm surface. Extend your __________ arm straight into the air while holding a __________ pound weight in your hand.  Keeping your head and back in place, lift your shoulder off the floor.  Hold for __________ seconds. Slowly return to the starting position, and allow your muscles to relax completely before starting the next repetition. Repeat __________ times. Complete this exercise __________ times per day.     STRENGTH - Scapular Protractors, Quadruped  Get onto your hands and knees, with your shoulders directly over your hands (or as close as you can be, comfortably).    Keeping your elbows locked, lift the back of your rib cage up into your shoulder blades, so your mid-back rounds out. Keep your neck muscles relaxed.    Hold this position for __________ seconds. Slowly return to the starting position and allow your muscles to relax completely before starting the next repetition. Repeat __________ times. Complete this exercise __________ times per day.       STRENGTH - Scapular Retractors  Secure a rubber exercise  band or tubing to a fixed object (table, pole), so that it is at the height of your shoulders when you are either standing, or sitting on a firm armless chair.  With a palm down grip, grasp an end of the band in each hand. Straighten your elbows and lift your hands straight in front of you, at shoulder height. Step back, away from the secured end of the band, until it becomes tense.  Squeezing your shoulder blades together, draw your elbows back toward your sides, as you bend them. Keep your upper arms lifted away from your body throughout the exercise.  Hold for __________ seconds. Slowly ease the tension on the band, as you reverse the directions and return to the starting position.   Repeat __________ times. Complete this exercise __________ times per day.  STRENGTH - Shoulder Extensors   Secure a rubber exercise band or tubing to a fixed object (table, pole) so that it is at the height of your shoulders when you are either standing, or sitting on a firm armless chair.  With a thumbs-up grip, grasp an end of the band in each hand. Straighten your elbows and lift your hands straight in front of you, at shoulder height. Step back, away from the secured end of the band, until it becomes tense.  Squeezing your shoulder blades together, pull your hands down to the sides of your thighs. Do not allow your hands to go behind you.    Hold for __________ seconds. Slowly ease the tension on the band, as you reverse the directions and return to the starting position.   Repeat __________ times. Complete this exercise __________ times per day.       STRENGTH - Scapular Retractors and External Rotators     Secure a rubber exercise band or tubing to a fixed object (table, pole) so that it is at the height as your shoulders, when you are either standing, or sitting on a firm armless chair.  With a palm down grip, grasp an end of the band in each hand. Bend your elbows 90 degrees and lift your elbows  to shoulder height, at your sides. Step back, away from the secured end of the band, until it becomes tense.  Squeezing your shoulder blades together, rotate your shoulders so that your upper arms and elbows remain stationary, but your fists travel upward to head height.  Hold for __________ seconds. Slowly ease the tension on the band, as you reverse the directions and return to the starting position.   Repeat __________ times. Complete this exercise __________ times per day.       STRENGTH - Scapular Retractors and External Rotators, Rowing   Secure a rubber exercise band or tubing to a fixed object (table, pole) so that it is at the height of your shoulders, when you are either standing, or sitting on a firm armless chair.  With a palm down grip, grasp an end of the band in each hand. Straighten your elbows and lift your hands straight in front of you, at shoulder height. Step back, away from the secured end of the band, until it becomes tense.  Step 1: Squeeze your shoulder blades together. Bending your elbows, draw your hands to your chest, as if you are rowing a boat. At the end of this motion, your hands and elbow should be at shoulder height and your elbows should be out to your sides.  Step 2: Rotate your shoulders, to raise your hands above your head. Your forearms should be vertical and your upper arms should be horizontal.  Hold for __________ seconds. Slowly ease the tension on the band, as you reverse the directions and return to the starting position.   Repeat __________ times. Complete this exercise __________ times per day.       STRENGTH - Scapular Depressors  Find a sturdy chair without wheels, such as a dining room chair.  Keeping your feet on the floor, and your hands on the chair arms, lift your bottom up from the seat, and lock your elbows.  Keeping your elbows straight, allow gravity to pull your body weight down. Your shoulders will rise toward your ears.  Raise  your body against gravity by drawing your shoulder blades down your back, shortening the distance between your shoulders and ears.  Although  your feet should always maintain contact with the floor, your feet should progressively support less body weight, as you get stronger.    Hold for __________ seconds. In a controlled and slow manner, lower your body weight to begin the next repetition. Repeat __________ times. Complete this exercise __________ times per day.     Document Released: 01/06/2005  Document Re-Released: 11/03/2008 [facility] Patient Information 2011 [place], [place].

## 2010-10-15 ENCOUNTER — Ambulatory Visit (HOSPITAL_COMMUNITY): Payer: BC Managed Care – PPO | Admitting: Specialist

## 2010-10-22 ENCOUNTER — Ambulatory Visit (HOSPITAL_COMMUNITY)
Admission: RE | Admit: 2010-10-22 | Discharge: 2010-10-22 | Disposition: A | Discharge status: Home / Self Care | Payer: BC Managed Care – PPO | Source: Ambulatory Visit | Attending: Orthopedic Surgery | Admitting: Orthopedic Surgery

## 2010-10-22 ENCOUNTER — Telehealth: Payer: Self-pay | Admitting: Internal Medicine

## 2010-10-22 ENCOUNTER — Encounter: Payer: Self-pay | Admitting: Internal Medicine

## 2010-10-22 DIAGNOSIS — IMO0001 Reserved for inherently not codable concepts without codable children: Secondary | ICD-10-CM | POA: Insufficient documentation

## 2010-10-22 DIAGNOSIS — M25619 Stiffness of unspecified shoulder, not elsewhere classified: Secondary | ICD-10-CM | POA: Insufficient documentation

## 2010-10-22 DIAGNOSIS — M25519 Pain in unspecified shoulder: Secondary | ICD-10-CM | POA: Insufficient documentation

## 2010-10-22 DIAGNOSIS — M6281 Muscle weakness (generalized): Secondary | ICD-10-CM | POA: Insufficient documentation

## 2010-10-22 DIAGNOSIS — M7551 Bursitis of right shoulder: Secondary | ICD-10-CM

## 2010-10-22 NOTE — Telephone Encounter (Signed)
I noticed on the Nov recall list that this pt needs to have a repeat abd ultrasound due in 6 months (Nov). I will mail letter to bring in for 3 month F/U and have U/S prior to OV.

## 2010-10-22 NOTE — Progress Notes (Signed)
Occupational Therapy Evaluation  Patient Details  Name: Robert Duarte MRN: [id number] Date of Birth: Sep 02, 1955  Today's Date: 10/22/2010 Time: 1600-1640 Time Calculation (min): 40 min OT Eval [id number] 15' Manual Therapy [id number] 20' Visit#: 1  of 18   Re-eval: 11/19/10  Assessment Diagnosis: Right Shoulder Pain Next MD Visit: 01/02/11 Prior Therapy: no MD:  Dr. Romeo Apple Past Medical History:  Past Medical History  Diagnosis Date  . Emphysema    Past Surgical History:  Past Surgical History  Procedure Date  . Tonsillectomy   . Colonoscopy 2002    internal hemorrhoids    Subjective  S:  I have been having pain and decreased mobility in my right shoulder for the past month. Pertinent History:  Mr. Scheff began experiencing unexplained pain and decreased mobility in his right shoulder one month ago.  He consulted with Dr. Romeo Apple who has diagnosed him with right shoulder pain due to rotator cuff syndrome and adhesive capsulitis.  He recieved a cortisone injection, but does not feel it has helped to alleviate his pain.  He has been referred to OT for eval and treament. Pain Assessment Currently in Pain?: Yes Pain Score:   3 Pain Location: Shoulder Pain Orientation: Right Pain Type: Acute pain Assessment RUE Assessment RUE Assessment:  (assessed in seated.  ER and IR assessed with 0 abd) RUE AROM (degrees) Right Shoulder Flexion  0-170: 105 Degrees Right Shoulder ABduction 0-140: 115 Degrees Right Shoulder Internal Rotation  0-70: 70 Degrees Right Shoulder External Rotation  0-90: 10 Degrees RUE Strength Right Shoulder Flexion: 5/5 (in available range) Right Shoulder ABduction: 4/5 Right Shoulder Internal Rotation: 5/5 (in available range) Right Shoulder External Rotation: 5/5 (in available range)  Exercise/Treatments  10/22/10 1400  Shoulder Exercises: Supine  Protraction PROM;5 reps  Horizontal ABduction PROM;5 reps  External Rotation PROM;5 reps    Internal Rotation PROM;5 reps  Flexion PROM;5 reps  ABduction PROM;5 reps   Manual Therapy Manual Therapy: Myofascial release Myofascial Release: MFR and manual stretching to right upper arm, scapular, shoulder, and pectoral region to decrease pain and increase mobility in his RUE.  Occupational Therapy Assessment and Plan OT Assessment and Plan Clinical Impression Statement: A:  Patient presents with decreased mobility and strength and increased pain and fascial restrictions in his right shoulder causing decreased I with all B/IADLs, work, leisure activities. Rehab Potential: Excellent OT Frequency: Min 3X/week OT Duration: 6 weeks OT Treatment/Interventions: Self-care/ADL training;Therapeutic exercise;Manual therapy;Therapeutic activities;Patient/family education (modalities PRN.  HEP for ball stretch and shoulder stretches) OT Plan: P:  Skilled OT intervention to decrease pain, fascial restrictions and increase AROM/PROM, strength, and I with functional activities.  Treatment Plan:  manual therapy as outlined above,.  Ther Ex:  supine PROM, dowel for shoulder, ball stretches, seated elev, ret, row,.  thumb tacks, prot ret// elev/dep, pulleys.  Progress as tolerated.   Goals Short Term Goals Time to Complete Short Term Goals: 3 weeks Short Term Goal 1: Patient will be educated on HEP. 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 Short Term Goal 3: Patient will decrease his pain level to 4/10 when attempting to reach behind his body at work. Short Term Goal 4: Patient will decrease fascial restrictions from max to mod in his right shoulder region. Short Term Goal 5: Patient will increase abduction strength to 4+/5 in given range for increased I with lifting items at work. Long Term Goals Time to Complete Long  Term Goals: Other (comment) (6 weeks) Long Term Goal 1: Patient will return to prior level of I with all B/IADL,  work, leisure activities. 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 Long Term Goal 3: Patient will increase his RUE strength to 5/5 in functional range for increased ability to lift items at work. 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 Long Term Goal 5: Patient will decrease fascial restrictions to minimal in his right shoulder region. End of Session Patient Active Problem List  Diagnoses  . Epigastric pain  . Helicobacter pylori (H. pylori) infection  . Rectal bleed  . Constipation  . Rectal prolapse  . Abnormal abdominal ultrasound  . Capsulitis of shoulder  . Bursitis of shoulder, right  . Pain in joint, shoulder region  . Muscle weakness (generalized)  . Rotator cuff syndrome of shoulder and allied disorders   End of Session Activity Tolerance: Patient tolerated treatment well General Behavior During Session: [facility] for tasks performed Cognition: [facility] for tasks performed  Time Calculation Start Time: 1600 Stop Time: 1640 Time Calculation (min): 40 min  Shirlean Mylar, OTR/L  10/22/2010, 4:52 PM

## 2010-10-23 NOTE — Telephone Encounter (Signed)
Crystal, Is he on your recall list?

## 2010-10-24 ENCOUNTER — Ambulatory Visit (HOSPITAL_COMMUNITY)
Admission: RE | Admit: 2010-10-24 | Discharge: 2010-10-24 | Disposition: A | Discharge status: Home / Self Care | Payer: BC Managed Care – PPO | Source: Ambulatory Visit | Attending: Orthopedic Surgery | Admitting: Orthopedic Surgery

## 2010-10-24 DIAGNOSIS — M25519 Pain in unspecified shoulder: Secondary | ICD-10-CM

## 2010-10-24 DIAGNOSIS — M6281 Muscle weakness (generalized): Secondary | ICD-10-CM

## 2010-10-24 NOTE — Progress Notes (Signed)
Occupational Therapy Treatment  Patient Details  Name: Robert Duarte MRN: [id number] Date of Birth: Mar 01, 1955  Today's Date: 10/24/2010 Time: 4098-1191 Time Calculation (min): 38 min Manual Therapy 1601-1620 19' Therapeutic Exercises [phone] 18' Visit#: 2  of 18   Re-eval: 11/19/10    Subjective S:  I tried the exercises.  It felt a little looser after our first visit, but after a few days, it tightens up. Pain Assessment Currently in Pain?: Yes Pain Score:   2 Pain Location: Shoulder Pain Orientation: Right Pain Type: Acute pain   O:  Exercise/Treatments  10/24/10 0700  Shoulder Exercises: Supine  Protraction PROM;AAROM;10 reps  Horizontal ABduction PROM;AAROM;10 reps  External Rotation PROM;AAROM;10 reps  Internal Rotation PROM;AAROM;10 reps  Flexion PROM;AAROM;10 reps  ABduction PROM;AAROM;10 reps  Shoulder Exercises: Seated  Elevation AROM;10 reps  Extension AROM;10 reps  Retraction AROM;10 reps  Row AROM;10 reps  Shoulder Exercises: Pulleys  Flexion 1 minute  ABduction 1 minute  Shoulder Exercises: Therapy Ball  Flexion 15 reps  ABduction 15 reps  Shoulder Exercises: ROM/Strengthening  Thumb Tacks 1 min  Prot/Ret//Elev/Dep 1 min   Manual Therapy Manual Therapy: Myofascial release Myofascial Release: MFR and manual stretching to right upper arm, scapular, shoulder, and pectoral region to decrease pain and increase mobility in his RUE.  [phone]  Occupational Therapy Assessment and Plan OT Assessment and Plan Clinical Impression Statement: A:  Increased PROM in flexion this date.  Abd and external rotation remain most restricted. OT Plan: P:  Increase to 12 reps with dowel.  Increase time on pulleys.   Goals Short Term Goals Time to Complete Short Term Goals: 3 weeks Short Term Goal 1: Patient will be educated on HEP. 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 Short Term Goal 3:  Patient will decrease his pain level to 4/10 when attempting to reach behind his body at work. Short Term Goal 4: Patient will decrease fascial restrictions from max to mod in his right shoulder region. Short Term Goal 5: Patient will increase abduction strength to 4+/5 in given range for increased I with lifting items at work. Long Term Goals Time to Complete Long Term Goals: Other (comment) (6 weeks) Long Term Goal 1: Patient will return to prior level of I with all B/IADL, work, leisure activities. 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 Long Term Goal 3: Patient will increase his RUE strength to 5/5 in functional range for increased ability to lift items at work. 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 Long Term Goal 5: Patient will decrease fascial restrictions to minimal in his right shoulder region. End of Session Patient Active Problem List  Diagnoses  . Epigastric pain  . Helicobacter pylori (H. pylori) infection  . Rectal bleed  . Constipation  . Rectal prolapse  . Abnormal abdominal ultrasound  . Capsulitis of shoulder  . Bursitis of shoulder, right  . Pain in joint, shoulder region  . Muscle weakness (generalized)  . Rotator cuff syndrome of shoulder and allied disorders   End of Session Activity Tolerance: Patient tolerated treatment well General Behavior During Session: [facility] for tasks performed Cognition: [facility] for tasks performed   Shirlean Mylar, OTR/L  10/24/2010, 4:45 PM

## 2010-10-28 ENCOUNTER — Ambulatory Visit (HOSPITAL_COMMUNITY)
Admission: RE | Admit: 2010-10-28 | Discharge: 2010-10-28 | Disposition: A | Discharge status: Home / Self Care | Payer: BC Managed Care – PPO | Source: Ambulatory Visit | Attending: Orthopedic Surgery | Admitting: Orthopedic Surgery

## 2010-10-28 DIAGNOSIS — M6281 Muscle weakness (generalized): Secondary | ICD-10-CM

## 2010-10-28 DIAGNOSIS — M25519 Pain in unspecified shoulder: Secondary | ICD-10-CM

## 2010-10-28 NOTE — Progress Notes (Signed)
Occupational Therapy Treatment  Patient Details  Name: Robert Duarte MRN: [id number] Date of Birth: Jun 13, 1955  Today's Date: 10/28/2010 Time: 2130-8657 Time Calculation (min): 48 min Visit#: 3  of 18   Re-eval: 11/19/10 [name] [phone]  95' Therapeutic Exercise  [phone]  21' Subjective Symptoms/Limitations Symptoms: S:  It is a little sore, I think it is the weather. Pain Assessment Currently in Pain?: Yes Pain Score:   3 Pain Location: Shoulder Pain Orientation: Right Pain Type: Acute pain  Exercise/Treatments Supine Protraction: PROM;10 reps;AAROM;15 reps Horizontal ABduction: PROM;10 reps;AAROM;15 reps External Rotation: PROM;10 reps;AAROM;15 reps Internal Rotation: PROM;10 reps;AAROM;15 reps Flexion: PROM;10 reps;AAROM;15 reps ABduction: PROM;10 reps;AAROM;15 reps Seated Elevation: AROM;12 reps Extension: AROM;12 reps Retraction: AROM;12 reps Row: AROM;12 reps Pulleys Flexion: 2 minutes ABduction: 2 minutes Therapy Ball Flexion: 20 reps ABduction: 20 reps ROM / Strengthening / Isometric Strengthening Thumb Tacks: 1 min Prot/Ret//Elev/Dep: 1 min   Manual Therapy Manual Therapy: Myofascial release Myofascial Release: MFR and manual stretching to right upper arm, scapular, shoulder, and pectoral region to decrease pain and increase mobility in his RUE.  Occupational Therapy Assessment and Plan OT Assessment and Plan Clinical Impression Statement: increased time with pullies, cues to keep shoulder depressed for correct form with pullies. Rehab Potential: Excellent OT Plan: add supine AROM.   Goals Short Term Goals Time to Complete Short Term Goals: 3 weeks Short Term Goal 1: Patient will be educated on HEP. 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 Short Term Goal 3: Patient will decrease his pain level to 4/10 when attempting to reach behind his body at work. Short Term Goal  4: Patient will decrease fascial restrictions from max to mod in his right shoulder region. Short Term Goal 5: Patient will increase abduction strength to 4+/5 in given range for increased I with lifting items at work. Long Term Goals Time to Complete Long Term Goals: Other (comment) (6 weeks) Long Term Goal 1: Patient will return to prior level of I with all B/IADL, work, leisure activities. 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 Long Term Goal 3: Patient will increase his RUE strength to 5/5 in functional range for increased ability to lift items at work. 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 Long Term Goal 5: Patient will decrease fascial restrictions to minimal in his right shoulder region. End of Session Patient Active Problem List  Diagnoses  . Epigastric pain  . Helicobacter pylori (H. pylori) infection  . Rectal bleed  . Constipation  . Rectal prolapse  . Abnormal abdominal ultrasound  . Capsulitis of shoulder  . Bursitis of shoulder, right  . Pain in joint, shoulder region  . Muscle weakness (generalized)  . Rotator cuff syndrome of shoulder and allied disorders   End of Session Activity Tolerance: Patient tolerated treatment well   Aarya Robinson L. Noralee Stain, COTA/L  10/28/2010, 6:28 PM

## 2010-10-30 ENCOUNTER — Ambulatory Visit (HOSPITAL_COMMUNITY)
Admission: RE | Admit: 2010-10-30 | Discharge: 2010-10-30 | Disposition: A | Discharge status: Home / Self Care | Payer: BC Managed Care – PPO | Source: Ambulatory Visit | Attending: Orthopedic Surgery | Admitting: Orthopedic Surgery

## 2010-10-30 DIAGNOSIS — M6281 Muscle weakness (generalized): Secondary | ICD-10-CM

## 2010-10-30 DIAGNOSIS — M25519 Pain in unspecified shoulder: Secondary | ICD-10-CM

## 2010-10-30 NOTE — Progress Notes (Signed)
Occupational Therapy Treatment  Patient Details  Name: Robert Duarte MRN: [id number] Date of Birth: May 07, 1955  Today's Date: 10/30/2010 Time: 5784-6962 Time Calculation (min): 40 min Visit#: 4  of 18   Re-eval: 11/19/10 [name]  [phone]  32' Therapeutic Exercise  [phone]  20'   Subjective Symptoms/Limitations Symptoms: S:  I put some icy hot on it, it feels pretty good right now.  It bothered me some last night. Pain Assessment Pain Score: 0-No pain  Exercise/Treatments Supine Protraction: PROM;AROM;10 reps Horizontal ABduction: PROM;AROM;10 reps External Rotation: PROM;AROM;10 reps Internal Rotation: PROM;AROM;10 reps Flexion: PROM;AROM;10 reps ABduction: PROM;AROM;10 reps Seated Elevation: AROM;15 reps Extension: AROM;15 reps Retraction: AROM;15 reps Row: AROM;15 reps Pulleys Flexion: 2 minutes ABduction: 2 minutes Therapy Ball Flexion: 25 reps ABduction: 25 reps ROM / Strengthening / Isometric Strengthening Wall Wash: 2' Thumb Tacks: 2' Prot/Ret//Elev/Dep: 1 min   Manual Therapy Manual Therapy: Myofascial release Myofascial Release: MFR and manual stretching to right upper arm, scapular, shoulder, and pectoral region to decrease pain and increase mobility in his RUE  Occupational Therapy Assessment and Plan OT Assessment and Plan Clinical Impression Statement: A:  Added AROM supine and wall wash. OT Plan: P:  Continue to increase AROM and decrease joint tightness.   Goals Short Term Goals Time to Complete Short Term Goals: 3 weeks Short Term Goal 1: Patient will be educated on HEP. 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 Short Term Goal 3: Patient will decrease his pain level to 4/10 when attempting to reach behind his body at work. Short Term Goal 4: Patient will decrease fascial restrictions from max to mod in his right shoulder region. Short Term Goal 5: Patient will  increase abduction strength to 4+/5 in given range for increased I with lifting items at work. Long Term Goals Time to Complete Long Term Goals: Other (comment) (6 weeks) Long Term Goal 1: Patient will return to prior level of I with all B/IADL, work, leisure activities. 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 Long Term Goal 3: Patient will increase his RUE strength to 5/5 in functional range for increased ability to lift items at work. 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 Long Term Goal 5: Patient will decrease fascial restrictions to minimal in his right shoulder region. End of Session Patient Active Problem List  Diagnoses  . Epigastric pain  . Helicobacter pylori (H. pylori) infection  . Rectal bleed  . Constipation  . Rectal prolapse  . Abnormal abdominal ultrasound  . Capsulitis of shoulder  . Bursitis of shoulder, right  . Pain in joint, shoulder region  . Muscle weakness (generalized)  . Rotator cuff syndrome of shoulder and allied disorders   End of Session Activity Tolerance: Patient tolerated treatment well General Behavior During Session: [facility] for tasks performed Cognition: [facility] for tasks performed   [name], COTA/L  10/30/2010, 4:36 PM

## 2010-10-31 ENCOUNTER — Other Ambulatory Visit: Payer: Self-pay | Admitting: Internal Medicine

## 2010-10-31 DIAGNOSIS — R935 Abnormal findings on diagnostic imaging of other abdominal regions, including retroperitoneum: Secondary | ICD-10-CM

## 2010-11-01 ENCOUNTER — Telehealth: Payer: Self-pay | Admitting: Gastroenterology

## 2010-11-01 ENCOUNTER — Telehealth (HOSPITAL_COMMUNITY): Payer: Self-pay | Admitting: Occupational Therapy

## 2010-11-01 ENCOUNTER — Inpatient Hospital Stay (HOSPITAL_COMMUNITY)
Admission: RE | Admit: 2010-11-01 | Payer: BC Managed Care – PPO | Source: Ambulatory Visit | Admitting: Occupational Therapy

## 2010-11-01 NOTE — Telephone Encounter (Signed)
Pt scheduled for his 106m repeat US on 11/06/10 @ 8:30- I spoke with pts wife and gave her info-

## 2010-11-04 ENCOUNTER — Ambulatory Visit (HOSPITAL_COMMUNITY)
Admission: RE | Admit: 2010-11-04 | Discharge: 2010-11-04 | Disposition: A | Discharge status: Home / Self Care | Payer: BC Managed Care – PPO | Source: Ambulatory Visit | Attending: Occupational Therapy | Admitting: Occupational Therapy

## 2010-11-04 DIAGNOSIS — M6281 Muscle weakness (generalized): Secondary | ICD-10-CM

## 2010-11-04 DIAGNOSIS — M25519 Pain in unspecified shoulder: Secondary | ICD-10-CM

## 2010-11-04 NOTE — Progress Notes (Signed)
Occupational Therapy Treatment  Patient Details  Name: Robert Duarte MRN: [id number] Date of Birth: 20-Jun-1955  Today's Date: 11/04/2010 Time: 4098-1191 Time Calculation (min): 40 min Visit#: 5  of 18   Re-eval: 11/19/10 [facility]  [phone]  62' Therapeutic Exercise  [phone] 23'   Subjective Symptoms/Limitations Symptoms: S:  I can lift my arm farther, it is a little sore. Pain Assessment Currently in Pain?: Yes Pain Score:   3   Exercise/Treatments Supine Protraction: PROM;AROM;12 reps Horizontal ABduction: PROM;AROM;12 reps External Rotation: PROM;AROM;12 reps Internal Rotation: PROM;AROM;12 reps Flexion: PROM;AROM;12 reps ABduction: PROM;AROM;12 reps Seated Elevation: AROM;15 reps Extension: AROM;15 reps Retraction: AROM;15 reps Row: AROM;15 reps Pulleys Flexion: 2 minutes ABduction: 2 minutes Therapy Ball Flexion: 25 reps ABduction: 25 reps ROM / Strengthening / Isometric Strengthening Wall Wash: 3' Thumb Tacks: 2' Prot/Ret//Elev/Dep: 1 min Manual Therapy Manual Therapy: Myofascial release Myofascial Release: MFR and manual stretching to right upper arm, scapular, shoulder, and pectoral region to decrease pain and increase mobility in his RUE  Occupational Therapy Assessment and Plan OT Assessment and Plan Clinical Impression Statement: A:  Added ball circles. Rehab Potential: Excellent OT Plan: P:  Add UBE   Goals Short Term Goals Time to Complete Short Term Goals: 3 weeks Short Term Goal 1: Patient will be educated on HEP. 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 Short Term Goal 3: Patient will decrease his pain level to 4/10 when attempting to reach behind his body at work. Short Term Goal 4: Patient will decrease fascial restrictions from max to mod in his right shoulder region. Short Term Goal 5: Patient will increase abduction strength to 4+/5 in given range for  increased I with lifting items at work. Long Term Goals Time to Complete Long Term Goals: Other (comment) (6 weeks) Long Term Goal 1: Patient will return to prior level of I with all B/IADL, work, leisure activities. 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 Long Term Goal 3: Patient will increase his RUE strength to 5/5 in functional range for increased ability to lift items at work. 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 Long Term Goal 5: Patient will decrease fascial restrictions to minimal in his right shoulder region. End of Session Patient Active Problem List  Diagnoses  . Epigastric pain  . Helicobacter pylori (H. pylori) infection  . Rectal bleed  . Constipation  . Rectal prolapse  . Abnormal abdominal ultrasound  . Capsulitis of shoulder  . Bursitis of shoulder, right  . Pain in joint, shoulder region  . Muscle weakness (generalized)  . Rotator cuff syndrome of shoulder and allied disorders   End of Session Activity Tolerance: Patient tolerated treatment well General Behavior During Session: [facility] for tasks performed Cognition: [facility] for tasks performed   Jaime Dome L. Rafeef Lau, COTA/L  11/04/2010, 5:45 PM

## 2010-11-06 ENCOUNTER — Ambulatory Visit (HOSPITAL_COMMUNITY)
Admission: RE | Admit: 2010-11-06 | Discharge: 2010-11-06 | Disposition: A | Discharge status: Home / Self Care | Payer: BC Managed Care – PPO | Source: Ambulatory Visit | Attending: Internal Medicine | Admitting: Internal Medicine

## 2010-11-06 DIAGNOSIS — K869 Disease of pancreas, unspecified: Secondary | ICD-10-CM | POA: Insufficient documentation

## 2010-11-06 DIAGNOSIS — R935 Abnormal findings on diagnostic imaging of other abdominal regions, including retroperitoneum: Secondary | ICD-10-CM

## 2010-11-07 ENCOUNTER — Ambulatory Visit (HOSPITAL_COMMUNITY)
Admission: RE | Admit: 2010-11-07 | Discharge: 2010-11-07 | Disposition: A | Discharge status: Home / Self Care | Payer: BC Managed Care – PPO | Source: Ambulatory Visit | Attending: Family Medicine | Admitting: Family Medicine

## 2010-11-07 DIAGNOSIS — M25519 Pain in unspecified shoulder: Secondary | ICD-10-CM

## 2010-11-07 DIAGNOSIS — M6281 Muscle weakness (generalized): Secondary | ICD-10-CM

## 2010-11-07 NOTE — Progress Notes (Signed)
Occupational Therapy Treatment  Patient Details  Name: Robert Duarte MRN: [id number] Date of Birth: July 29, 1955  Today's Date: 11/07/2010 Time: 4098-1191 Manual Therapy [phone] Therapeutic Exercises [phone]' Time Calculation (min): 57 min Visit#: 6  of 18   Re-eval: 11/19/10    Subjective S:  I had to have an ultrasound yesterday, and I couldnt get my arm behind my head. Pain Assessment Currently in Pain?: Yes Pain Score:   1 Pain Location: Shoulder Pain Orientation: Right Pain Type: Acute pain  O:  Exercise/Treatments   11/07/10 0700  Shoulder Exercises: Supine  Protraction PROM;10 reps;AAROM;AROM;15 reps  Horizontal ABduction PROM;10 reps;15 reps  External Rotation PROM;10 reps;AROM;AAROM;15 reps  Internal Rotation PROM;10 reps;AROM;AAROM;15 reps  Flexion PROM;10 reps;AROM;AAROM;15 reps  ABduction PROM;10 reps;AROM;AAROM;15 reps  Shoulder Exercises: Seated  Elevation AROM;15 reps  Extension AROM;15 reps  Retraction AROM;15 reps  Row AROM;15 reps  Protraction AROM;10 reps  Horizontal ABduction AROM;10 reps  External Rotation AROM;10 reps  Internal Rotation AROM;10 reps  Flexion AROM;10 reps  Abduction AROM;10 reps  Shoulder Exercises: Pulleys  Flexion 2 minutes  ABduction 2 minutes  Shoulder Exercises: Therapy Ball  Flexion 25 reps  ABduction 25 reps  Right/Left 5 reps  Shoulder Exercises: ROM/Strengthening  UBE (Upper Arm Bike) 3' and 3' 1.0  Wall Wash 3.5 minutes  Thumb Tacks 1 min  "W" Arms begin next visit  X to V Arms begin next visit  Prot/Ret//Elev/Dep 1 min   Manual Therapy Manual Therapy: Myofascial release Myofascial Release: MFR and manual stretching to right upper arm, scapular region, pectoral region, and shoulder region to decrease pain and increase mobility in his right shoulder.  [phone]  Occupational Therapy Assessment and Plan OT Assessment and Plan Clinical Impression Statement: A:  Added UBE and seated AROM.  Noted  scar tissue release this date. OT Plan: P:  Add 1 pound to supine exercises.  add w arms and x to v.   Goals Short Term Goals Time to Complete Short Term Goals: 3 weeks Short Term Goal 1: Patient will be educated on HEP. 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 Short Term Goal 3: Patient will decrease his pain level to 4/10 when attempting to reach behind his body at work. Short Term Goal 4: Patient will decrease fascial restrictions from max to mod in his right shoulder region. Short Term Goal 5: Patient will increase abduction strength to 4+/5 in given range for increased I with lifting items at work. Long Term Goals Time to Complete Long Term Goals: Other (comment) (6 weeks) Long Term Goal 1: Patient will return to prior level of I with all B/IADL, work, leisure activities. 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 Long Term Goal 3: Patient will increase his RUE strength to 5/5 in functional range for increased ability to lift items at work. 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 Long Term Goal 5: Patient will decrease fascial restrictions to minimal in his right shoulder region. End of Session Patient Active Problem List  Diagnoses  . Epigastric pain  . Helicobacter pylori (H. pylori) infection  . Rectal bleed  . Constipation  . Rectal prolapse  . Abnormal abdominal ultrasound  . Capsulitis of shoulder  . Bursitis of shoulder, right  . Pain in joint, shoulder region  . Muscle weakness (generalized)  .  Rotator cuff syndrome of shoulder and allied disorders   End of Session Activity Tolerance: Patient tolerated treatment well General Behavior During Session: [facility] for tasks performed Cognition: [facility] for tasks performed   Shirlean Mylar, OTR/L  11/07/2010, 4:13 PM

## 2010-11-08 ENCOUNTER — Ambulatory Visit (HOSPITAL_COMMUNITY)
Admission: RE | Admit: 2010-11-08 | Discharge: 2010-11-08 | Disposition: A | Discharge status: Home / Self Care | Payer: BC Managed Care – PPO | Source: Ambulatory Visit | Attending: Family Medicine | Admitting: Family Medicine

## 2010-11-08 DIAGNOSIS — M25519 Pain in unspecified shoulder: Secondary | ICD-10-CM

## 2010-11-08 DIAGNOSIS — M6281 Muscle weakness (generalized): Secondary | ICD-10-CM

## 2010-11-08 NOTE — Progress Notes (Signed)
Occupational Therapy Treatment  Patient Details  Name: SAJJAD HONEA MRN: [id number] Date of Birth: 08/23/1955  Today's Date: 11/08/2010 Time: 4098-1191 Time Calculation (min): 43 min Visit#: 7  of 18   Re-eval: 11/19/10 Therapeutic Exercise  [phone]  24' Manuel Therapy  [phone]  18'    Subjective Symptoms/Limitations Symptoms: S:  It popped the other day with her, it hurt but feels better now and a little looser. Pain Assessment Currently in Pain?: Yes Pain Score:   1 Pain Location: Shoulder Pain Orientation: Right Pain Type: Acute pain Exercise/Treatments Supine Protraction: PROM;Strengthening;10 reps;Weights Protraction Weight (lbs): 1# Horizontal ABduction: PROM;Strengthening;10 reps;Weights Horizontal ABduction Weight (lbs): 1# External Rotation: PROM;10 reps;Strengthening;Weights External Rotation Weight (lbs): 1# Internal Rotation: PROM;AROM;10 reps;Weights Internal Rotation Weight (lbs): 1# Flexion: PROM;Strengthening;12 reps;Weights Shoulder Flexion Weight (lbs): 1# ABduction: PROM;Strengthening;12 reps;Weights Shoulder ABduction Weight (lbs): 1# Seated Elevation: AROM;15 reps Extension: AROM;15 reps Retraction: AROM;15 reps Row: AROM;15 reps Protraction: AROM;12 reps Horizontal ABduction: AROM;12 reps External Rotation: AROM;12 reps Internal Rotation: AROM;12 reps Flexion: AROM;12 reps Abduction: AROM;12 reps Pulleys Flexion: 2 minutes ABduction: 2 minutes Therapy Ball Flexion: 25 reps ABduction: 25 reps Right/Left: 5 reps ROM / Strengthening / Isometric Strengthening UBE (Upper Arm Bike): 3' and 3' 1.5 Wall Wash: 4 Thumb Tacks: 2' "W" Arms: 10 X to V Arms: 10 Prot/Ret//Elev/Dep: 1'  Manual Therapy Manual Therapy: Myofascial release Myofascial Release: MFR and manual stretching to right upper arm, scapular, shoulder, and pectoral region to decrease pain and increase mobility in his RUE  Occupational Therapy Assessment and Plan OT  Assessment and Plan Clinical Impression Statement: A:  Added X to V and W arms, added 1# to supine.  Tolerated increase in PROM today. OT Plan: P:  Reassess.  Add tband to scap strengtheners    Goals Short Term Goals Time to Complete Short Term Goals: 3 weeks Short Term Goal 1: Patient will be educated on HEP. 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 Short Term Goal 3: Patient will decrease his pain level to 4/10 when attempting to reach behind his body at work. Short Term Goal 4: Patient will decrease fascial restrictions from max to mod in his right shoulder region. Short Term Goal 5: Patient will increase abduction strength to 4+/5 in given range for increased I with lifting items at work. Long Term Goals Time to Complete Long Term Goals: Other (comment) (6 weeks) Long Term Goal 1: Patient will return to prior level of I with all B/IADL, work, leisure activities. 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 Long Term Goal 3: Patient will increase his RUE strength to 5/5 in functional range for increased ability to lift items at work. 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 Long Term Goal 5: Patient will decrease fascial restrictions to minimal in his right shoulder region. End of Session Patient Active Problem List  Diagnoses  . Epigastric pain  . Helicobacter pylori (H. pylori) infection  . Rectal bleed  . Constipation  . Rectal prolapse  . Abnormal abdominal ultrasound  . Capsulitis of shoulder  . Bursitis of shoulder, right  . Pain in joint, shoulder region  . Muscle weakness (generalized)  . Rotator cuff syndrome of shoulder and allied disorders   End of Session Activity Tolerance: Patient tolerated treatment well General Behavior During Session: [facility] for  tasks performed Cognition: [facility] for tasks  performed   Keanu Lesniak L. Toua Stites, COTA/L  11/08/2010, 4:22 PM

## 2010-11-11 ENCOUNTER — Ambulatory Visit (HOSPITAL_COMMUNITY)
Admission: RE | Admit: 2010-11-11 | Discharge: 2010-11-11 | Disposition: A | Discharge status: Home / Self Care | Payer: BC Managed Care – PPO | Source: Ambulatory Visit | Attending: Orthopedic Surgery | Admitting: Orthopedic Surgery

## 2010-11-11 DIAGNOSIS — M6281 Muscle weakness (generalized): Secondary | ICD-10-CM

## 2010-11-11 DIAGNOSIS — M25519 Pain in unspecified shoulder: Secondary | ICD-10-CM

## 2010-11-11 NOTE — Progress Notes (Signed)
Occupational Therapy Treatment  Patient Details  Name: Robert Duarte MRN: [id number] Date of Birth: 06-21-1955  Today's Date: 11/11/2010 Time: 4098-1191 Time Calculation (min): 52 min Visit#: 9  of 18   Re-eval: 11/19/10 [facility] Therapy [phone]  62' Therapeutic Exercise  [phone]  35'    Subjective Symptoms/Limitations Symptoms: S:  I have been stretching it.  It is more sore than painful. Pain Assessment Currently in Pain?: No/denies  Exercise/Treatments Supine Protraction: PROM;Strengthening;10 reps;Weights Protraction Weight (lbs): 2# Horizontal ABduction: PROM;Strengthening;10 reps;Weights Horizontal ABduction Weight (lbs): 2# External Rotation: PROM;10 reps;Strengthening;Weights External Rotation Weight (lbs): 2# Internal Rotation: PROM;Weights;Strengthening;10 reps Internal Rotation Weight (lbs): 2# Flexion: PROM;Strengthening;Weights;10 reps Shoulder Flexion Weight (lbs): 2# ABduction: PROM;Strengthening;Weights;10 reps Shoulder ABduction Weight (lbs): 2# Seated Protraction: Strengthening;10 reps;Weights Protraction Weight (lbs): 1# Horizontal ABduction: Strengthening;12 reps;Weights Horizontal ABduction Weight (lbs): 1# External Rotation: Strengthening;10 reps;Weights External Rotation Weight (lbs): 1# Internal Rotation: Strengthening;10 reps;Weights Internal Rotation Weight (lbs): 1# Flexion: Strengthening;10 reps;Weights Flexion Weight (lbs): 1# Abduction: Strengthening;10 reps;Weights   Therapy Ball Flexion: 25 reps ABduction: 25 reps Right/Left: 5 reps ROM / Strengthening / Isometric Strengthening Wall Wash: 4 Thumb Tacks: 2' "W" Arms: 10 X to V Arms: 10 Prot/Ret//Elev/Dep: 2'    Manual Therapy Manual Therapy: Myofascial release Myofascial Release: MFR and manual stretching to right upper arm, scapular, shoulder, and pectoral region to decrease pain and increase mobility in his RUE.  Occupational Therapy Assessment and Plan OT Assessment  and Plan Clinical Impression Statement: A:  Changed to tband for scapula stab ex.  Tolerated increase PROM today. Rehab Potential: Excellent OT Plan: P:  Increase reps with tband.  Reassess next week.   Goals Short Term Goals Time to Complete Short Term Goals: 3 weeks Short Term Goal 1: Patient will be educated on HEP. 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 Short Term Goal 3: Patient will decrease his pain level to 4/10 when attempting to reach behind his body at work. Short Term Goal 4: Patient will decrease fascial restrictions from max to mod in his right shoulder region. Short Term Goal 5: Patient will increase abduction strength to 4+/5 in given range for increased I with lifting items at work. Long Term Goals Time to Complete Long Term Goals: Other (comment) (6 weeks) Long Term Goal 1: Patient will return to prior level of I with all B/IADL, work, leisure activities. 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 Long Term Goal 3: Patient will increase his RUE strength to 5/5 in functional range for increased ability to lift items at work. 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 Long Term Goal 5: Patient will decrease fascial restrictions to minimal in his right shoulder region. End of Session Patient Active Problem List  Diagnoses  . Epigastric pain  . Helicobacter pylori (H. pylori) infection  . Rectal bleed  . Constipation  . Rectal prolapse  . Abnormal abdominal ultrasound  . Capsulitis of shoulder  . Bursitis of shoulder, right  . Pain in joint, shoulder region  . Muscle weakness (generalized)  . Rotator cuff syndrome of shoulder and allied disorders   End of Session Activity Tolerance: Patient tolerated treatment well General Behavior During Session: [facility] for tasks performed Cognition: [facility] for tasks  performed    Lenord Fralix L. Kashina Mecum, COTA/L  11/11/2010, 4:45 PM

## 2010-11-13 ENCOUNTER — Ambulatory Visit (HOSPITAL_COMMUNITY)
Admission: RE | Admit: 2010-11-13 | Discharge: 2010-11-13 | Disposition: A | Discharge status: Home / Self Care | Payer: BC Managed Care – PPO | Source: Ambulatory Visit | Attending: Orthopedic Surgery | Admitting: Orthopedic Surgery

## 2010-11-13 DIAGNOSIS — M6281 Muscle weakness (generalized): Secondary | ICD-10-CM

## 2010-11-13 DIAGNOSIS — M25519 Pain in unspecified shoulder: Secondary | ICD-10-CM

## 2010-11-13 NOTE — Progress Notes (Signed)
Occupational Therapy Treatment  Patient Details  Name: Robert Duarte MRN: [id number] Date of Birth: 1955/07/09  Today's Date: 11/13/2010 Time: 1478-2956 Time Calculation (min): 47 min Manual Therapy [phone] 20' Therapeutic Exercises [phone] 26' Visit#: 10  of 18   Re-eval: 11/19/10    Subjective Symptoms/Limitations Symptoms: S:  It kept me awake last night.  Im not sure what made it so sore. Pain Assessment Currently in Pain?: Yes Pain Score:   2 Pain Location: Shoulder Pain Orientation: Right  O:  Exercise/Treatments  11/13/10 0700 Shoulder Exercises: Supine Protraction PROM;10 reps;Strengthening;15 reps Protraction Weight (lbs) 2# Horizontal ABduction PROM;10 reps;Strengthening;15 reps Horizontal ABduction Weight (lbs) 2# External Rotation PROM;10 reps;Strengthening;15 reps External Rotation Weight (lbs) 2# Internal Rotation PROM;10 reps;Strengthening;15 reps Internal Rotation Weight (lbs) 2# Flexion PROM;10 reps;Strengthening;15 reps Shoulder Flexion Weight (lbs) 2# ABduction PROM;10 reps;Strengthening;15 reps Shoulder ABduction Weight (lbs) 2# Shoulder Exercises: Seated Extension Theraband;10 reps (red) Retraction Theraband;10 reps (red) Row Theraband;10 reps (red) Protraction Strengthening;12 reps Protraction Weight (lbs) 1# Horizontal ABduction Strengthening;12 reps Horizontal ABduction Weight (lbs) 1# External Rotation Strengthening;12 reps External Rotation Weight (lbs) 1# Internal Rotation Strengthening;12 reps Internal Rotation Weight (lbs) 1# Flexion Strengthening;12 reps Flexion Weight (lbs) 1# Abduction Strengthening;12 reps ABduction Weight (lbs) 1# Shoulder Exercises: Pulleys Flexion (dc) ABduction (dc) Shoulder Exercises: Therapy Ball Flexion (hold) ABduction (hold today) Right/Left (hold) Shoulder Exercises: ROM/Strengthening UBE (Upper Arm Bike) 3' and 3' 1.5 Wall Wash 2 min with 1 pound Thumb Tacks 1' "W" Arms 10 with 1  pound X to V Arms 10 with 1 pound Prot/Ret//Elev/Dep 1'  Manual Therapy Manual Therapy: Myofascial release Myofascial Release: MFR and manual stretching to right upper arm, scapular, shoulder, and pectoral region to decrease pain and increase mobilty in his RUE.  Gentle traction x 1 minute. [phone]  Occupational Therapy Assessment and Plan OT Assessment and Plan Clinical Impression Statement: A:  Increased reps and added weights to several exercises. OT Plan: P:  Increase UBE to 2.0.   Goals Short Term Goals Time to Complete Short Term Goals: 3 weeks Short Term Goal 1: Patient will be educated on HEP. 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 Short Term Goal 3: Patient will decrease his pain level to 4/10 when attempting to reach behind his body at work. Short Term Goal 4: Patient will decrease fascial restrictions from max to mod in his right shoulder region. Short Term Goal 5: Patient will increase abduction strength to 4+/5 in given range for increased I with lifting items at work. Long Term Goals Time to Complete Long Term Goals: Other (comment) (6 weeks) Long Term Goal 1: Patient will return to prior level of I with all B/IADL, work, leisure activities. 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 Long Term Goal 3: Patient will increase his RUE strength to 5/5 in functional range for increased ability to lift items at work. 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 Long Term Goal 5: Patient will decrease fascial restrictions to minimal in his right shoulder region. End of Session Patient Active Problem List  Diagnoses  . Epigastric pain  . Helicobacter pylori (H. pylori) infection  . Rectal bleed  . Constipation  . Rectal prolapse  . Abnormal abdominal ultrasound  . Capsulitis of shoulder  . Bursitis of shoulder,  right  . Pain in  joint, shoulder region  . Muscle weakness (generalized)  . Rotator cuff syndrome of shoulder and allied disorders   End of Session Activity Tolerance: Patient tolerated treatment well General Behavior During Session: [facility] for tasks performed Cognition: [facility] for tasks performed   Shirlean Mylar, OTR/L  11/13/2010, 4:48 PM

## 2010-11-15 ENCOUNTER — Ambulatory Visit (HOSPITAL_COMMUNITY)
Admission: RE | Admit: 2010-11-15 | Discharge: 2010-11-15 | Disposition: A | Discharge status: Home / Self Care | Payer: BC Managed Care – PPO | Source: Ambulatory Visit | Attending: Family Medicine | Admitting: Family Medicine

## 2010-11-15 DIAGNOSIS — M6281 Muscle weakness (generalized): Secondary | ICD-10-CM

## 2010-11-15 DIAGNOSIS — M25519 Pain in unspecified shoulder: Secondary | ICD-10-CM

## 2010-11-15 NOTE — Progress Notes (Signed)
Occupational Therapy Treatment  Patient Details  Name: Robert Duarte MRN: [id number] Date of Birth: 1955/04/17  Today's Date: 11/15/2010 Time: 4098-1191 Time Calculation (min): 43 min Visit#: 11  of 18   Re-eval: 11/19/10 Manuel Therapy [phone]  27' Moist heat  [phone]    Subjective Symptoms/Limitations Symptoms: S:  This thing has been giving me a fit since tues.  I wonder if it is the weather. Pain Assessment Currently in Pain?: Yes Pain Score:   3 Pain Location: Shoulder Pain Orientation: Right Pain Type: Acute pain   Exercise/Treatments Supine Protraction: PROM;10 reps Horizontal ABduction: PROM;10 reps External Rotation: PROM;10 reps Internal Rotation: PROM;10 reps Flexion: PROM;10 reps ABduction: PROM;10 reps   Manual Therapy Manual Therapy: Myofascial release Myofascial Release: MFR and manual stretching to right upper arm, scapular, shoulder and pectoral region to decrease pain and increase mobility in his RUE.  Occupational Therapy Assessment and Plan OT Assessment and Plan Clinical Impression Statement: A:  Held exercises today secondary to increased pain and discomfort.  Patient states he is having spasms that keep him up at night.  Applied heat after MFR and maneul stretching..  Patient with some decrease in spasms after treatment . Rehab Potential: Excellent OT Plan: P:  Resume exercises.   Goals Short Term Goals Time to Complete Short Term Goals: 3 weeks Short Term Goal 1: Patient will be educated on HEP. 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 Short Term Goal 3: Patient will decrease his pain level to 4/10 when attempting to reach behind his body at work. Short Term Goal 4: Patient will decrease fascial restrictions from max to mod in his right shoulder region. Short Term Goal 5: Patient will increase abduction strength to 4+/5 in given range for increased I with lifting items  at work. Long Term Goals Time to Complete Long Term Goals: Other (comment) (6 weeks) Long Term Goal 1: Patient will return to prior level of I with all B/IADL, work, leisure activities. 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 Long Term Goal 3: Patient will increase his RUE strength to 5/5 in functional range for increased ability to lift items at work. 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 Long Term Goal 5: Patient will decrease fascial restrictions to minimal in his right shoulder region. End of Session Patient Active Problem List  Diagnoses  . Epigastric pain  . Helicobacter pylori (H. pylori) infection  . Rectal bleed  . Constipation  . Rectal prolapse  . Abnormal abdominal ultrasound  . Capsulitis of shoulder  . Bursitis of shoulder, right  . Pain in joint, shoulder region  . Muscle weakness (generalized)  . Rotator cuff syndrome of shoulder and allied disorders   End of Session Activity Tolerance: Patient limited by pain   Robert Duarte, COTA/L  11/15/2010, 6:08 PM

## 2010-11-18 ENCOUNTER — Ambulatory Visit (HOSPITAL_COMMUNITY)
Admission: RE | Admit: 2010-11-18 | Discharge: 2010-11-18 | Disposition: A | Discharge status: Home / Self Care | Payer: BC Managed Care – PPO | Source: Ambulatory Visit | Attending: Orthopedic Surgery | Admitting: Orthopedic Surgery

## 2010-11-18 DIAGNOSIS — M25519 Pain in unspecified shoulder: Secondary | ICD-10-CM

## 2010-11-18 DIAGNOSIS — M6281 Muscle weakness (generalized): Secondary | ICD-10-CM

## 2010-11-18 NOTE — Progress Notes (Signed)
Occupational Therapy Treatment  Patient Details  Name: Robert Duarte MRN: [id number] Date of Birth: [birth year]  Today's Date: 11/18/2010 Time: 0865-7846 Time Calculation (min): 57 min Manual Therapy [id number] 17' Therapeutic Exercises [phone] 24' Visit#: 12  of 18   Re-eval: 12/16/10    Subjective Symptoms/Limitations Symptoms: S:  It was pretty sore over the weekend, now it just aches. Pain Assessment Currently in Pain?: Yes Pain Score:   2 Pain Location: Shoulder Pain Orientation: Right Pain Type: Acute pain  O:  Exercise/Treatments   11/18/10 0700 Shoulder Exercises: Supine Protraction PROM;10 reps;Strengthening;15 reps Protraction Weight (lbs) 2# Horizontal ABduction PROM;10 reps;Strengthening;15 reps Horizontal ABduction Weight (lbs) 2# External Rotation PROM;10 reps;Strengthening;15 reps External Rotation Weight (lbs) 2# Internal Rotation PROM;10 reps;Strengthening;15 reps Internal Rotation Weight (lbs) 2# Flexion PROM;10 reps;Strengthening;15 reps Shoulder Flexion Weight (lbs) 2# ABduction PROM;10 reps;Strengthening;15 reps Shoulder ABduction Weight (lbs) 2# Shoulder Exercises: Seated Extension Theraband;12 reps (red) Retraction Theraband;12 reps (red) Row Theraband;12 reps (red) Protraction Strengthening;15 reps Protraction Weight (lbs) 2# Horizontal ABduction Strengthening;10 reps Horizontal ABduction Weight (lbs) 2# External Rotation Strengthening;15 reps;Theraband;12 reps (red) External Rotation Weight (lbs) 2# Internal Rotation Strengthening;15 reps;Theraband;12 reps (red) Internal Rotation Weight (lbs) 2# Flexion Strengthening;10 reps Flexion Weight (lbs) 2# Abduction Strengthening;10 reps ABduction Weight (lbs) 2# Shoulder Exercises: Therapy Ball Flexion 25 reps ABduction 25 reps Right/Left 5 reps Shoulder Exercises: ROM/Strengthening UBE (Upper Arm Bike) 3'and 3' 2.0 Cybex Press (begin next visit) Cybex Row (begin next  visit) Wall Wash 3' with 1 pound Thumb Tacks 1' "W" Arms 10 with 2 pounds X to V Arms 10 with 2 pounds Prot/Ret//Elev/Dep 1'  Manual Therapy Manual Therapy: Myofascial release Myofascial Release: MFR and manual stretching to right upper arm, scapular, shoulder, and pectoral region to decrease pain and increase mobility in RUE 1523-1540   Occupational Therapy Assessment and Plan OT Assessment and Plan Clinical Impression Statement: A:  Please see monthly progress note. OT Plan: P:  Continue 3 times a week x 4 weeks.  Increase to 3 pounds with supine strengthening.  Add cybex press and row.   Goals Short Term Goals Time to Complete Short Term Goals: 3 weeks Short Term Goal 1: Patient will be educated on HEP. 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 Short Term Goal 2 Progress: Met Short Term Goal 3: Patient will decrease his pain level to 4/10 when attempting to reach behind his body at work. Short Term Goal 3 Progress: Met Short Term Goal 4: Patient will decrease fascial restrictions from max to mod in his right shoulder region. Short Term Goal 4 Progress: Met Short Term Goal 5: Patient will increase abduction strength to 4+/5 in given range for increased I with lifting items at work. Short Term Goal 5 Progress: Met Long Term Goals Time to Complete Long Term Goals: Other (comment) (6 weeks) Long Term Goal 1: Patient will return to prior level of I with all B/IADL, work, leisure activities. 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 Long Term Goal 2 Progress: Progressing toward goal Long Term Goal 3: Patient will increase his RUE strength to 5/5 in functional range for increased ability to lift items at work. 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 Long Term Goal 4 Progress: Progressing toward goal Long Term Goal 5: Patient will decrease fascial restrictions to minimal in his right shoulder region. Long Term Goal 5 Progress: Progressing toward goal End of Session Patient Active Problem List  Diagnoses  . Epigastric pain  . Helicobacter pylori (H. pylori) infection  . Rectal bleed  . Constipation  . Rectal prolapse  . Abnormal abdominal ultrasound  . Capsulitis of shoulder  . Bursitis of shoulder, right  . Pain in joint, shoulder region  . Muscle weakness (generalized)  . Rotator cuff syndrome of shoulder and allied disorders   End of Session Activity Tolerance: Patient tolerated treatment well General Behavior During Session: [facility] for tasks performed Cognition: [facility] for tasks performed   Shirlean Mylar, OTR/L  11/18/2010, 4:29 PM

## 2010-11-20 ENCOUNTER — Ambulatory Visit (HOSPITAL_COMMUNITY)
Admission: RE | Admit: 2010-11-20 | Discharge: 2010-11-20 | Disposition: A | Discharge status: Home / Self Care | Payer: BC Managed Care – PPO | Source: Ambulatory Visit | Attending: Orthopedic Surgery | Admitting: Orthopedic Surgery

## 2010-11-20 DIAGNOSIS — M25519 Pain in unspecified shoulder: Secondary | ICD-10-CM

## 2010-11-20 DIAGNOSIS — M6281 Muscle weakness (generalized): Secondary | ICD-10-CM

## 2010-11-20 NOTE — Progress Notes (Signed)
Occupational Therapy Treatment  Patient Details  Name: Robert Duarte MRN: [id number] Date of Birth: 25-Aug-1955  Today's Date: 11/20/2010 Time: 4098-1191 Time Calculation (min): 61 min Visit#: 13  of 18   Re-eval: 12/16/10 [facility]  [phone] 62' Therapeutic Exercise [phone] 34'     Subjective Symptoms/Limitations Symptoms: S:  I tried to push some trashcans and could feel it pull. Pain Assessment Currently in Pain?: No/denies   Exercise/Treatments Supine Protraction: PROM;Strengthening;10 reps Protraction Weight (lbs): 3# Horizontal ABduction: PROM;Strengthening;10 reps Horizontal ABduction Weight (lbs): 3# External Rotation: PROM;Strengthening;10 reps External Rotation Weight (lbs): 3# Internal Rotation: PROM;Strengthening;10 reps Internal Rotation Weight (lbs): 3# Flexion: PROM;Strengthening;10 reps Shoulder Flexion Weight (lbs): 3# ABduction: PROM;Strengthening;10 reps Shoulder ABduction Weight (lbs): 3# Seated Extension: Theraband;12 reps Theraband Level (Shoulder Extension): Level 2 (Red) Retraction: Theraband;12 reps Theraband Level (Shoulder Retraction): Level 2 (Red) Row: Theraband;12 reps Theraband Level (Shoulder Row): Level 2 (Red) Protraction: Strengthening;15 reps Protraction Weight (lbs): 2# Horizontal ABduction: Strengthening;10 reps Horizontal ABduction Weight (lbs): 2# External Rotation: Strengthening;15 reps;Theraband;12 reps External Rotation Weight (lbs): 2# Internal Rotation: Strengthening;15 reps;Theraband;12 reps Internal Rotation Weight (lbs): 2# Flexion: Strengthening;10 reps Flexion Weight (lbs): 2# Abduction: Strengthening;10 reps ABduction Weight (lbs): 2# Therapy Ball Flexion: 25 reps ABduction: 25 reps Right/Left: 5 reps ROM / Strengthening / Isometric Strengthening UBE (Upper Arm Bike): 3'and 3' 2.5 Wall Wash: 4' with 1 pound Thumb Tacks: 1' "W" Arms: 10 with 2 pounds X to V Arms: 10 with 2  pounds Prot/Ret//Elev/Dep: 1'  Manual Therapy Manual Therapy: Myofascial release Myofascial Release: MFR and manual stretching to right upper arm, scapular, shoulder, and pectoral region to decrease pain and increase mobility in his RUE.  Occupational Therapy Assessment and Plan OT Assessment and Plan Clinical Impression Statement: A:  Increase supine to 3# and cybex press and row. Rehab Potential: Excellent OT Plan: P:  Increase reps.   Goals Short Term Goals Time to Complete Short Term Goals: 3 weeks Short Term Goal 1: Patient will be educated on HEP. 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 Short Term Goal 3: Patient will decrease his pain level to 4/10 when attempting to reach behind his body at work. Short Term Goal 4: Patient will decrease fascial restrictions from max to mod in his right shoulder region. Short Term Goal 5: Patient will increase abduction strength to 4+/5 in given range for increased I with lifting items at work. Long Term Goals Time to Complete Long Term Goals: Other (comment) (6 weeks) Long Term Goal 1: Patient will return to prior level of I with all B/IADL, work, leisure activities. 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 Long Term Goal 3: Patient will increase his RUE strength to 5/5 in functional range for increased ability to lift items at work. 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 Long Term Goal 5: Patient will decrease fascial restrictions to minimal in his right shoulder region. End of Session Patient Active Problem List  Diagnoses  . Epigastric pain  . Helicobacter pylori (H. pylori) infection  . Rectal bleed  . Constipation  . Rectal prolapse  . Abnormal abdominal ultrasound  . Capsulitis of shoulder  . Bursitis of shoulder, right  . Pain in joint, shoulder region  .  Muscle weakness (generalized)  . Rotator cuff syndrome of shoulder and allied disorders  End of Session Activity Tolerance: Patient tolerated treatment well General Behavior During Session: [facility] for tasks performed Cognition: [facility] for tasks performed   Deven Audi L. Tajuan Dufault, COTA/L  11/20/2010, 5:16 PM

## 2010-11-21 ENCOUNTER — Ambulatory Visit (HOSPITAL_COMMUNITY)
Admission: RE | Admit: 2010-11-21 | Discharge: 2010-11-21 | Disposition: A | Discharge status: Home / Self Care | Payer: BC Managed Care – PPO | Source: Ambulatory Visit | Attending: Orthopedic Surgery | Admitting: Orthopedic Surgery

## 2010-11-21 DIAGNOSIS — M6281 Muscle weakness (generalized): Secondary | ICD-10-CM

## 2010-11-21 DIAGNOSIS — M25519 Pain in unspecified shoulder: Secondary | ICD-10-CM | POA: Insufficient documentation

## 2010-11-21 DIAGNOSIS — M25619 Stiffness of unspecified shoulder, not elsewhere classified: Secondary | ICD-10-CM | POA: Insufficient documentation

## 2010-11-21 DIAGNOSIS — IMO0001 Reserved for inherently not codable concepts without codable children: Secondary | ICD-10-CM | POA: Insufficient documentation

## 2010-11-21 NOTE — Progress Notes (Signed)
Occupational Therapy Treatment  Patient Details  Name: Robert Duarte MRN: [id number] Date of Birth: [birth year]  Today's Date: 11/21/2010 Time: 4098-1191 Time Calculation (min): 47 min Visit#: 14  of 18   Re-eval: 12/16/10 [facility] Therapy  [phone] 62' Therapeutic Exercise  [phone] 24'    Subjective Symptoms/Limitations Symptoms: S:  My hip is really hurting I didn't sleep much last ngiht. Pain Assessment Currently in Pain?: No/denies  Exercise/Treatments Supine Protraction: PROM;10 reps;Strengthening;12 reps Protraction Weight (lbs): 3# Horizontal ABduction: PROM;10 reps;Strengthening;12 reps Horizontal ABduction Weight (lbs): 3# External Rotation: PROM;Strengthening;12 reps External Rotation Weight (lbs): 3# Internal Rotation: PROM;Strengthening;12 reps Internal Rotation Weight (lbs): 3# Flexion: PROM;Strengthening;12 reps Shoulder Flexion Weight (lbs): 3# ABduction: PROM;Strengthening;12 reps Shoulder ABduction Weight (lbs): 3# Seated Extension: Theraband;12 reps Theraband Level (Shoulder Extension): Level 3 (Green) Retraction: Theraband;12 reps Theraband Level (Shoulder Retraction): Level 3 (Green) Row: Theraband;12 reps Theraband Level (Shoulder Row): Level 3 (Green) Protraction: Strengthening;15 reps Protraction Weight (lbs): 2# Horizontal ABduction: Strengthening;10 reps Horizontal ABduction Weight (lbs): 2# External Rotation: Strengthening;15 reps;Theraband;12 reps Theraband Level (Shoulder External Rotation): Level 3 (Green) External Rotation Weight (lbs): 2# Internal Rotation: Strengthening;15 reps;Theraband;12 reps Theraband Level (Shoulder Internal Rotation): Level 3 (Green) Internal Rotation Weight (lbs): 2# Flexion: Strengthening;10 reps Flexion Weight (lbs): 2# Abduction: Strengthening;10 reps ABduction Weight (lbs): 2# Therapy Ball Flexion:  (d/c) ABduction:  (d/c) Right/Left: 5 reps ROM / Strengthening / Isometric Strengthening UBE (Upper  Arm Bike): 3'and 3' 3.0 Cybex Press: 1 plate;15 reps Cybex Row: 1 plate;15 reps Wall Wash: 2' with 2# Thumb Tacks: 1' "W" Arms: 10 with 2 pounds X to V Arms: 10 with 2 pounds Manual Therapy Manual Therapy: Myofascial release Myofascial Release: MFR and manual stretching to right upper arm, scapular, shoulder, and pectoral region to decrease pain and increase mobility in his RUE.  Occupational Therapy Assessment and Plan OT Assessment and Plan Clinical Impression Statement: A:  Added tband to hep. OT Plan: P:  Attempt 3# seated.   Goals Short Term Goals Time to Complete Short Term Goals: 3 weeks Short Term Goal 1: Patient will be educated on HEP. 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 Short Term Goal 3: Patient will decrease his pain level to 4/10 when attempting to reach behind his body at work. Short Term Goal 4: Patient will decrease fascial restrictions from max to mod in his right shoulder region. Short Term Goal 5: Patient will increase abduction strength to 4+/5 in given range for increased I with lifting items at work. Long Term Goals Time to Complete Long Term Goals: Other (comment) (6 weeks) Long Term Goal 1: Patient will return to prior level of I with all B/IADL, work, leisure activities. 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 Long Term Goal 3: Patient will increase his RUE strength to 5/5 in functional range for increased ability to lift items at work. 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 Long Term Goal 5: Patient will decrease fascial restrictions to minimal in his right shoulder region. End of Session Patient Active Problem List  Diagnoses  . Epigastric pain  . Helicobacter pylori (H. pylori) infection  . Rectal bleed  . Constipation  . Rectal prolapse  . Abnormal abdominal ultrasound  .  Capsulitis of shoulder  . Bursitis of shoulder, right  . Pain in joint, shoulder  region  . Muscle weakness (generalized)  . Rotator cuff syndrome of shoulder and allied disorders   End of Session Activity Tolerance: Patient tolerated treatment well General Behavior During Session: [facility] for tasks performed Cognition: [facility] for tasks performed  Manuelito Poage L. Rhyker Silversmith, COTA/L  11/21/2010, 4:33 PM

## 2010-11-26 NOTE — Progress Notes (Signed)
Quick Note:  Pt aware, please nic 1 year repeat U/S ______

## 2011-01-02 ENCOUNTER — Ambulatory Visit: Payer: BC Managed Care – PPO | Admitting: Orthopedic Surgery

## 2011-01-02 ENCOUNTER — Encounter: Payer: Self-pay | Admitting: Orthopedic Surgery

## 2011-09-08 IMAGING — CR DG CHEST 2V
2 series · 2 of 2 positions shown · non-contrast
Comparison: 04/25/2010.

CLINICAL DATA: Chest pain.  History of emphysema.

CHEST - 2 VIEW

[view not recorded (1 of 2)]
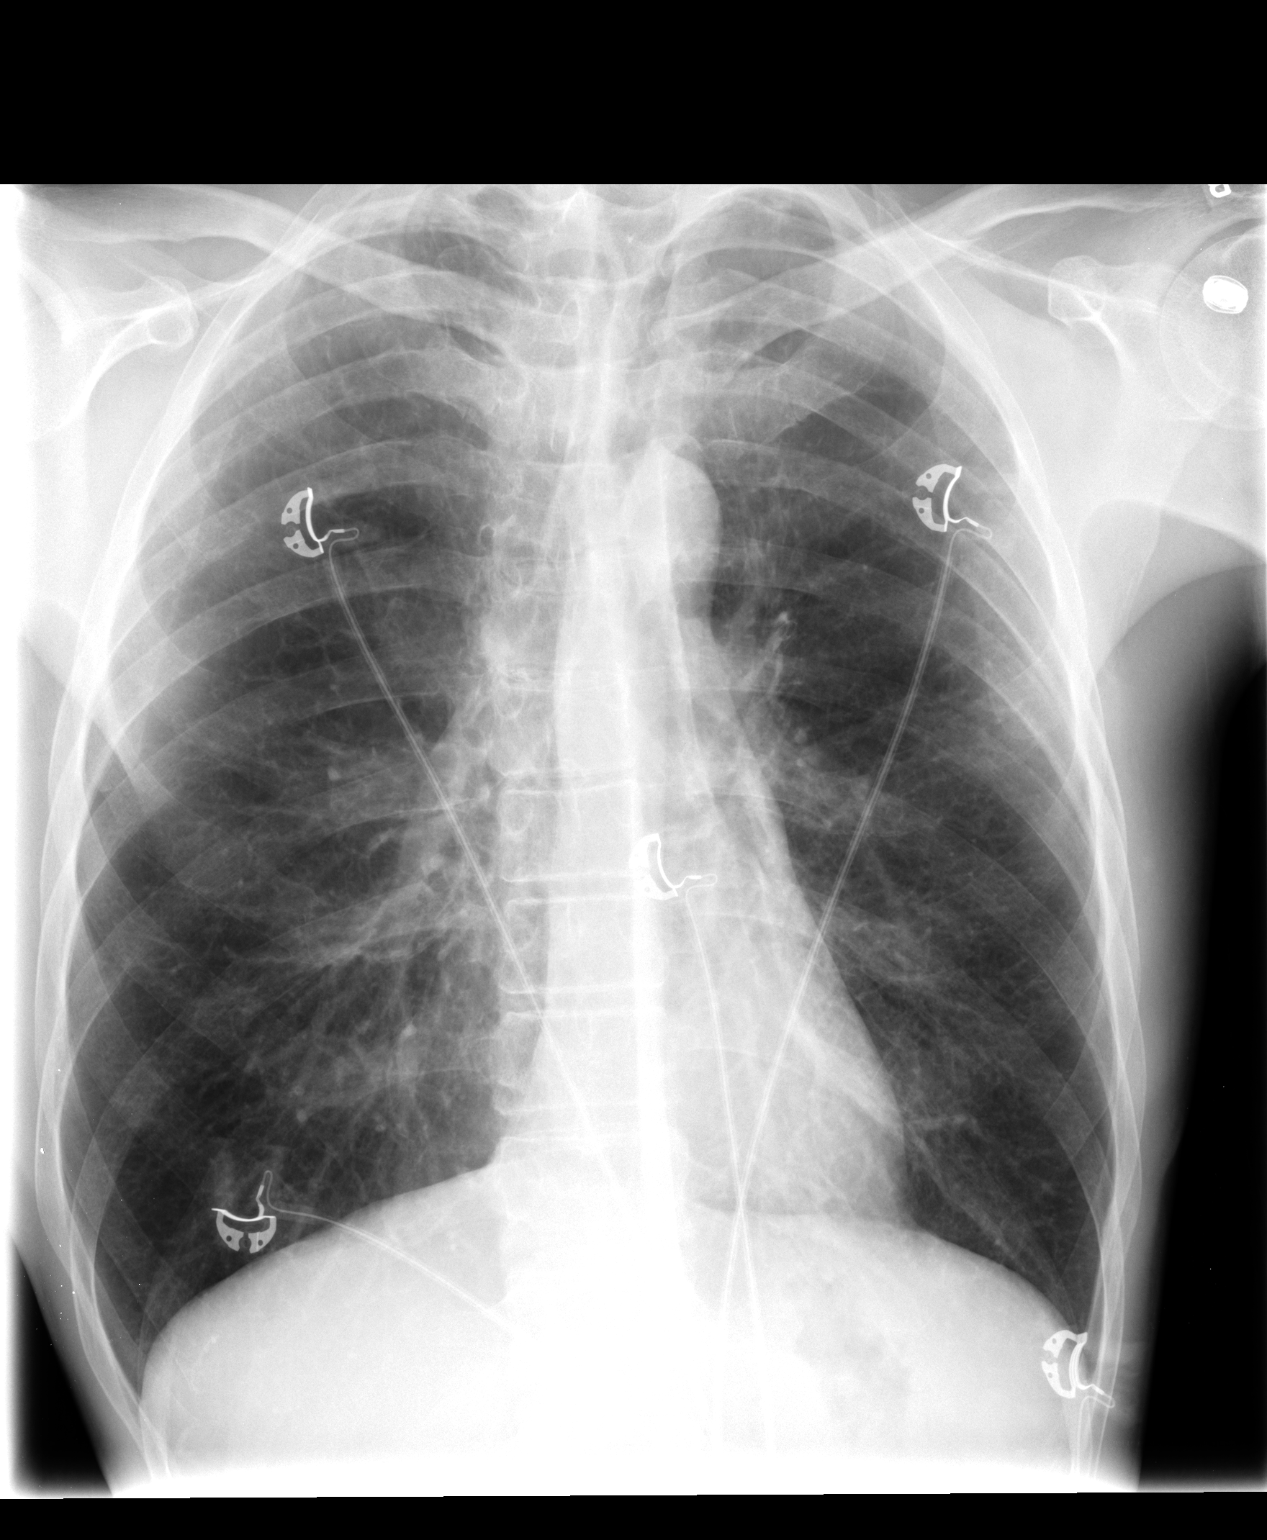

[view not recorded (2 of 2)]
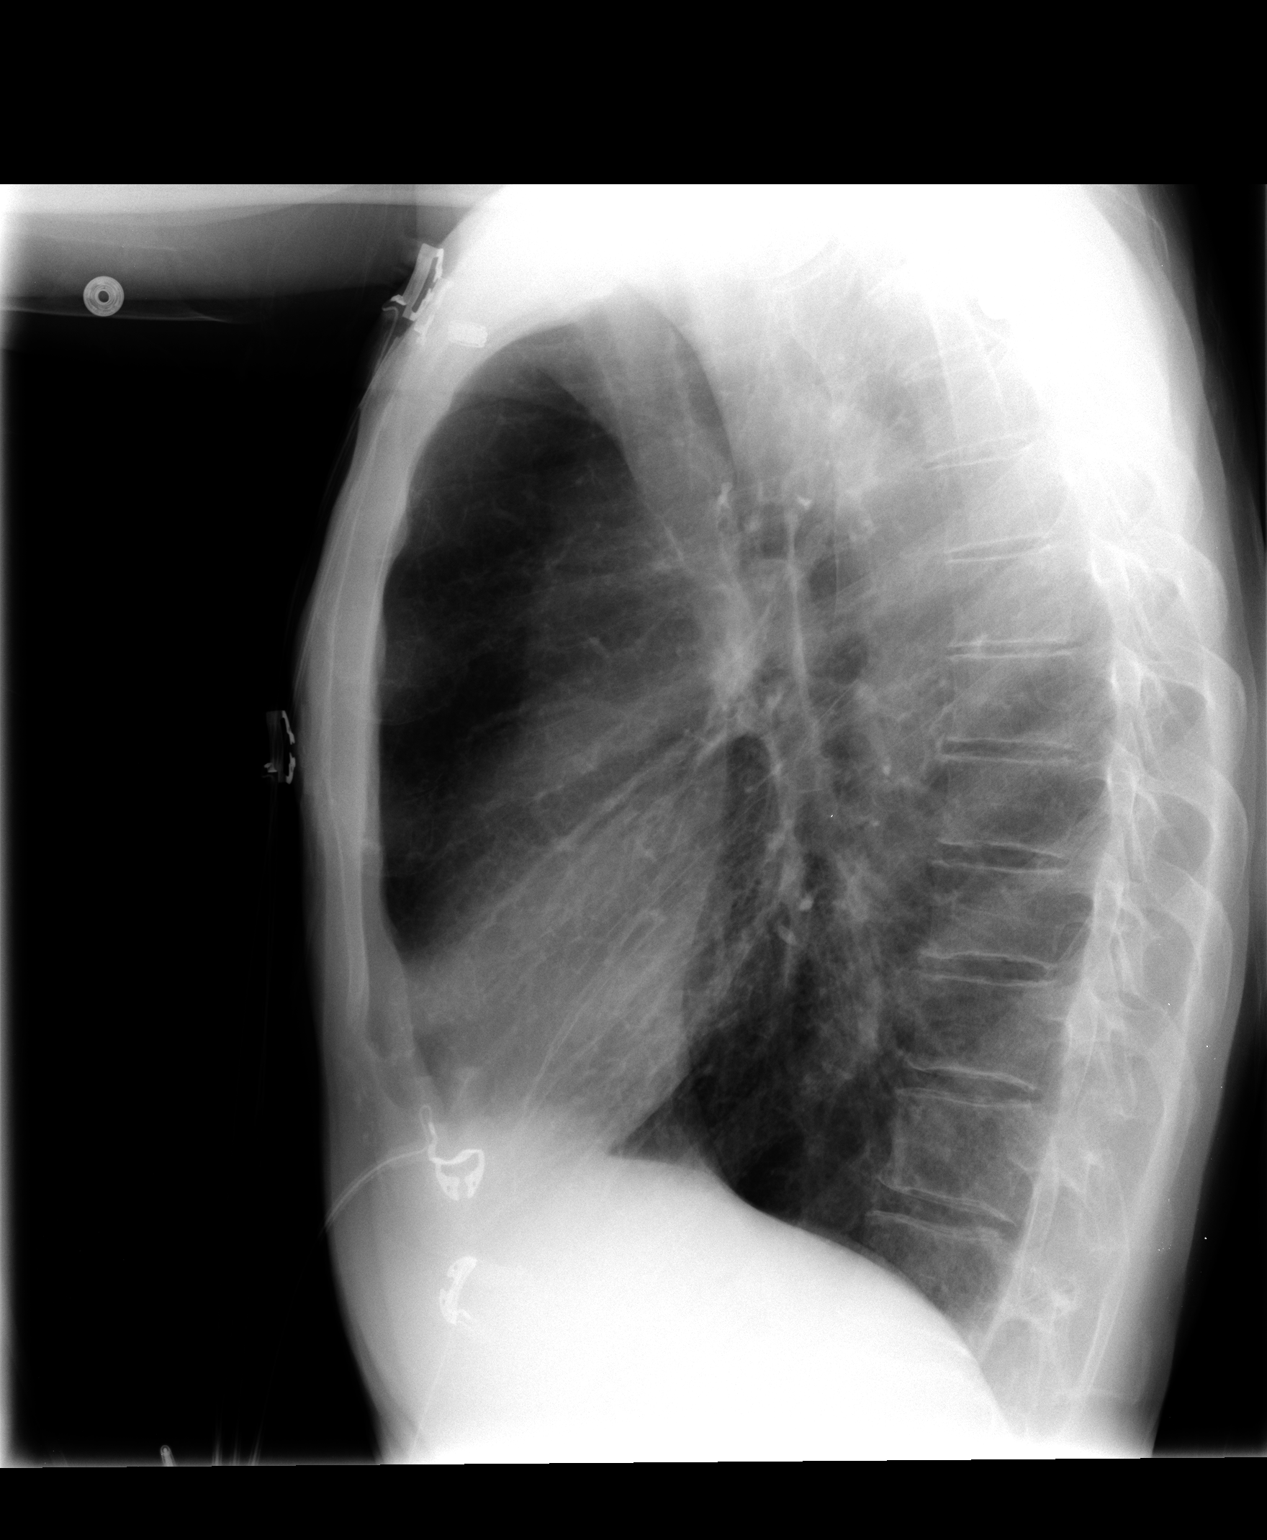

[2 of 2 positions shown; findings below may reference images not displayed]

FINDINGS: Mild patient rotation to the left.  Heart size and
mediastinal contours are stable.  The lungs are hyperinflated with
stable biapical pleural parenchymal scarring.  There is no evidence
of edema, pleural effusion or pneumothorax.
IMPRESSION: Stable chronic lung disease with emphysema.  No acute
cardiopulmonary process demonstrated.

## 2012-01-20 ENCOUNTER — Telehealth: Payer: Self-pay | Admitting: *Deleted

## 2012-01-20 ENCOUNTER — Other Ambulatory Visit: Payer: Self-pay | Admitting: Internal Medicine

## 2012-01-20 DIAGNOSIS — K869 Disease of pancreas, unspecified: Secondary | ICD-10-CM

## 2012-01-20 NOTE — Telephone Encounter (Signed)
Pt is on the Nov recall for ultrasound in one year

## 2012-01-20 NOTE — Telephone Encounter (Signed)
Patients phone # has been disconnected I have mailed him a letter asking him to call and get this scheduled

## 2012-02-09 ENCOUNTER — Ambulatory Visit (HOSPITAL_COMMUNITY)
Admission: RE | Admit: 2012-02-09 | Discharge: 2012-02-09 | Disposition: A | Discharge status: Home / Self Care | Payer: BC Managed Care – PPO | Source: Ambulatory Visit | Attending: Internal Medicine | Admitting: Internal Medicine

## 2012-02-09 DIAGNOSIS — Z09 Encounter for follow-up examination after completed treatment for conditions other than malignant neoplasm: Secondary | ICD-10-CM | POA: Insufficient documentation

## 2012-02-09 DIAGNOSIS — K869 Disease of pancreas, unspecified: Secondary | ICD-10-CM | POA: Insufficient documentation

## 2013-03-07 ENCOUNTER — Other Ambulatory Visit (HOSPITAL_COMMUNITY): Payer: Self-pay | Admitting: Family Medicine

## 2013-03-07 DIAGNOSIS — R059 Cough, unspecified: Secondary | ICD-10-CM

## 2013-03-07 DIAGNOSIS — R05 Cough: Secondary | ICD-10-CM

## 2013-03-11 ENCOUNTER — Ambulatory Visit (HOSPITAL_COMMUNITY)
Admission: RE | Admit: 2013-03-11 | Discharge: 2013-03-11 | Disposition: A | Discharge status: Home / Self Care | Payer: PRIVATE HEALTH INSURANCE | Source: Ambulatory Visit | Attending: Family Medicine | Admitting: Family Medicine

## 2013-03-11 DIAGNOSIS — R059 Cough, unspecified: Secondary | ICD-10-CM | POA: Insufficient documentation

## 2013-03-11 DIAGNOSIS — J4489 Other specified chronic obstructive pulmonary disease: Secondary | ICD-10-CM | POA: Insufficient documentation

## 2013-03-11 DIAGNOSIS — R05 Cough: Secondary | ICD-10-CM | POA: Insufficient documentation

## 2013-03-11 DIAGNOSIS — J449 Chronic obstructive pulmonary disease, unspecified: Secondary | ICD-10-CM | POA: Insufficient documentation

## 2013-07-22 ENCOUNTER — Encounter (HOSPITAL_COMMUNITY): Payer: Self-pay | Admitting: Emergency Medicine

## 2013-07-22 ENCOUNTER — Emergency Department (HOSPITAL_COMMUNITY)
Admission: EM | Admit: 2013-07-22 | Discharge: 2013-07-22 | Disposition: A | Discharge status: Home / Self Care | Payer: PRIVATE HEALTH INSURANCE | Attending: Emergency Medicine | Admitting: Emergency Medicine

## 2013-07-22 DIAGNOSIS — F17200 Nicotine dependence, unspecified, uncomplicated: Secondary | ICD-10-CM | POA: Insufficient documentation

## 2013-07-22 DIAGNOSIS — IMO0002 Reserved for concepts with insufficient information to code with codable children: Secondary | ICD-10-CM | POA: Insufficient documentation

## 2013-07-22 DIAGNOSIS — L255 Unspecified contact dermatitis due to plants, except food: Secondary | ICD-10-CM

## 2013-07-22 DIAGNOSIS — Z79899 Other long term (current) drug therapy: Secondary | ICD-10-CM | POA: Insufficient documentation

## 2013-07-22 DIAGNOSIS — Z880 Allergy status to penicillin: Secondary | ICD-10-CM | POA: Insufficient documentation

## 2013-07-22 DIAGNOSIS — Z792 Long term (current) use of antibiotics: Secondary | ICD-10-CM | POA: Insufficient documentation

## 2013-07-22 DIAGNOSIS — J438 Other emphysema: Secondary | ICD-10-CM | POA: Insufficient documentation

## 2013-07-22 MED ORDER — PREDNISONE 50 MG PO TABS
60.0000 mg | ORAL_TABLET | Freq: Once | ORAL | Status: AC
  Administered 2013-07-22: 60 mg via ORAL
  Filled 2013-07-22 (×2): qty 1

## 2013-07-22 MED ORDER — PREDNISONE 50 MG PO TABS: ORAL_TABLET | ORAL | Status: DC

## 2013-07-22 NOTE — ED Notes (Signed)
Pt had already been seen and eval by time seen by  Me, and ready for d/c.  Alert, NAd

## 2013-07-22 NOTE — ED Notes (Signed)
Rash to face and arms.

## 2013-07-22 NOTE — Discharge Instructions (Signed)
Poison Oak Poison oak is a rash caused by touching the leaves of the poison oak plant. You may have a rash with redness and itching. Sometimes, blisters appear and break open. Your eyes may get puffy (swollen). Poison oak often heals in 2 to 3 weeks without treatment.  HOME CARE  If you touch poison oak:  Wash your skin with soap and water right away. Wash under your fingernails. Do not rub the skin very hard.  Wash any clothes you were wearing.  Avoid poison oak in the future. Poison oak usually has 3 leaves on a stem.  Use medicines to help with itching as told by your doctor. Do not drive when you take this medicine.  Keep open sores dry, clean, and covered with a bandage and medicated cream, if needed.  Ask your doctor about medicine for children. GET HELP RIGHT AWAY IF:  You have open sores.  Redness spreads beyond the area of the rash.  There is yellowish white fluid (pus) coming from the rash.  Pain gets worse.  You have a temperature by mouth above 102 F (38.9 C), not controlled by medicine. MAKE SURE YOU:  Understand these instructions.  Will watch your condition.  Will get help right away if you are not doing well or get worse. Document Released: 02/08/2010 Document Revised: 03/31/2011 Document Reviewed: 02/08/2010 Exit[employer] Patient Information 2015 [employer]. This information is not intended to replace advice given to you by your health care provider. Make sure you discuss any questions you have with your health care provider.  

## 2013-07-24 NOTE — ED Provider Notes (Signed)
CSN: [id number]     Arrival date & time 07/22/13  1009 History   First MD Initiated Contact with Patient 07/22/13 1036     Chief Complaint  Patient presents with  . Poison Ivy     (Consider location/radiation/quality/duration/timing/severity/associated sxs/prior Treatment) Patient is a 58 y.o. male presenting with poison ivy. The history is provided by the patient.  Poison Karlene Einstein This is a new problem. The current episode started in the past 7 days. The problem occurs constantly. The problem has been unchanged. Associated symptoms include a rash. Pertinent negatives include no chest pain, chills, coughing, fever, headaches, joint swelling, myalgias, nausea, neck pain, numbness, sore throat, swollen glands, visual change, vomiting or weakness. Nothing aggravates the symptoms. He has tried nothing for the symptoms. The treatment provided no relief.    Past Medical History  Diagnosis Date  . Emphysema    Past Surgical History  Procedure Laterality Date  . Tonsillectomy    . Colonoscopy  2002    internal hemorrhoids   Family History  Problem Relation Age of Onset  . Lung cancer Father     deceased  . Heart disease Mother     heart valve replacement  . Colon cancer Neg Hx   . Liver disease Neg Hx    History  Substance Use Topics  . Smoking status: Current Every Day Smoker -- 1.00 packs/day for 29 years    Types: Cigarettes  . Smokeless tobacco: Never Used  . Alcohol Use: No    Review of Systems  Constitutional: Negative for fever, chills, activity change and appetite change.  HENT: Negative for facial swelling, sore throat and trouble swallowing.   Respiratory: Negative for cough, chest tightness, shortness of breath and wheezing.   Cardiovascular: Negative for chest pain.  Gastrointestinal: Negative for nausea and vomiting.  Musculoskeletal: Negative for joint swelling, myalgias, neck pain and neck stiffness.  Skin: Positive for rash. Negative for wound.  Neurological:  Negative for dizziness, weakness, numbness and headaches.  All other systems reviewed and are negative.     Allergies  Penicillins  Home Medications   Prior to Admission medications   Medication Sig Start Date End Date Taking? Authorizing Provider  clarithromycin (BIAXIN XL) 500 MG 24 hr tablet Take 1 tablet by mouth Twice daily. 04/25/10   Historical Provider, MD  metroNIDAZOLE (FLAGYL) 500 MG tablet Take 1 tablet by mouth Three times a day. 04/25/10   Historical Provider, MD  omeprazole (PRILOSEC) 40 MG capsule Take 1 tablet by mouth daily. 04/25/10   Historical Provider, MD  predniSONE (DELTASONE) 50 MG tablet One po qd x 4 days 07/22/13   Rilynn Habel L. Derico Mitton, PA-C   BP 134/81  Pulse 65  Temp(Src) 97.9 F (36.6 C) (Oral)  Resp 16  SpO2 100% Physical Exam  Nursing note and vitals reviewed. Constitutional: He is oriented to person, place, and time. He appears well-developed and well-nourished. No distress.  HENT:  Head: Normocephalic and atraumatic.  Mouth/Throat: Oropharynx is clear and moist.  Neck: Normal range of motion. Neck supple.  Cardiovascular: Normal rate, regular rhythm, normal heart sounds and intact distal pulses.   No murmur heard. Pulmonary/Chest: Effort normal and breath sounds normal. No respiratory distress. He has no wheezes. He exhibits no tenderness.  Musculoskeletal: He exhibits no edema and no tenderness.  Lymphadenopathy:    He has no cervical adenopathy.  Neurological: He is alert and oriented to person, place, and time. He exhibits normal muscle tone. Coordination normal.  Skin: Skin is  warm. Rash noted. There is erythema.  Erythematous maculopapular rash tot he face and bilateral forearms.  Few vesicles also present in a linear pattern on the arms.  No edema.  No pustules     ED Course  Procedures (including critical care time) Labs Review Labs Reviewed - No data to display  Imaging Review No results found.   EKG Interpretation None      MDM    Final diagnoses:  Plant dermatitis    Pt is well appearing.  Airway patent.  No edema.  Rash c/w plant dermatitis.  Pt agrees to sx tx with prednisone and OTC benadryl .  He appears stable for d/c and agrees to plan    Quan Cybulski L. Vanessa , PA-C 07/24/13 1842

## 2013-07-25 NOTE — ED Provider Notes (Signed)
Medical screening examination/treatment/procedure(s) were performed by non-physician practitioner and as supervising physician I was immediately available for consultation/collaboration.   EKG Interpretation None        Sharyon Cable, MD 07/25/13 [phone]

## 2013-11-17 ENCOUNTER — Ambulatory Visit (HOSPITAL_COMMUNITY)
Admission: RE | Admit: 2013-11-17 | Discharge: 2013-11-17 | Disposition: A | Discharge status: Home / Self Care | Payer: PRIVATE HEALTH INSURANCE | Source: Ambulatory Visit | Attending: Family Medicine | Admitting: Family Medicine

## 2013-11-17 ENCOUNTER — Other Ambulatory Visit (HOSPITAL_COMMUNITY): Payer: Self-pay | Admitting: Family Medicine

## 2013-11-17 DIAGNOSIS — J208 Acute bronchitis due to other specified organisms: Secondary | ICD-10-CM | POA: Insufficient documentation

## 2013-11-17 DIAGNOSIS — R05 Cough: Secondary | ICD-10-CM | POA: Diagnosis present

## 2015-02-16 IMAGING — CR DG CHEST 2V
2 series · 2 of 2 positions shown · non-contrast
Comparison: 03/11/2013

CLINICAL DATA: Cough and congestion.  COPD

EXAM:
CHEST  2 VIEW

[view not recorded (1 of 2)]
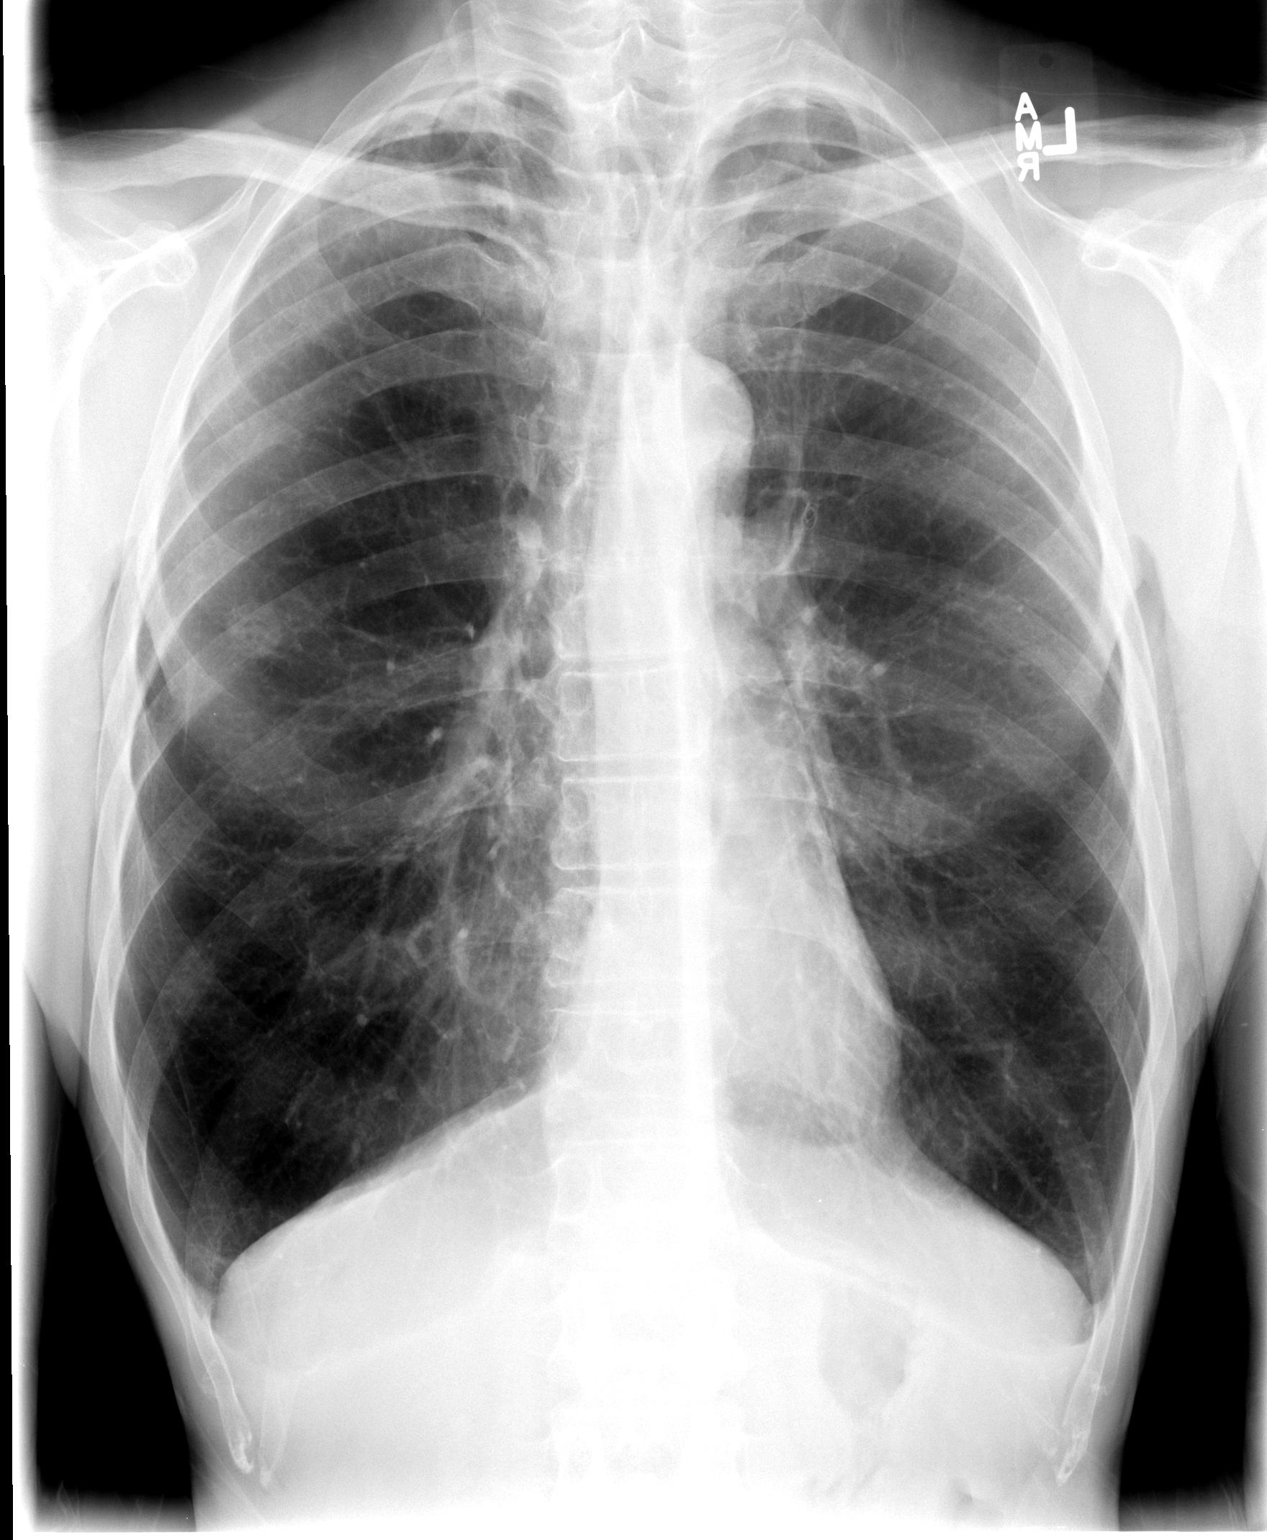

[view not recorded (2 of 2)]
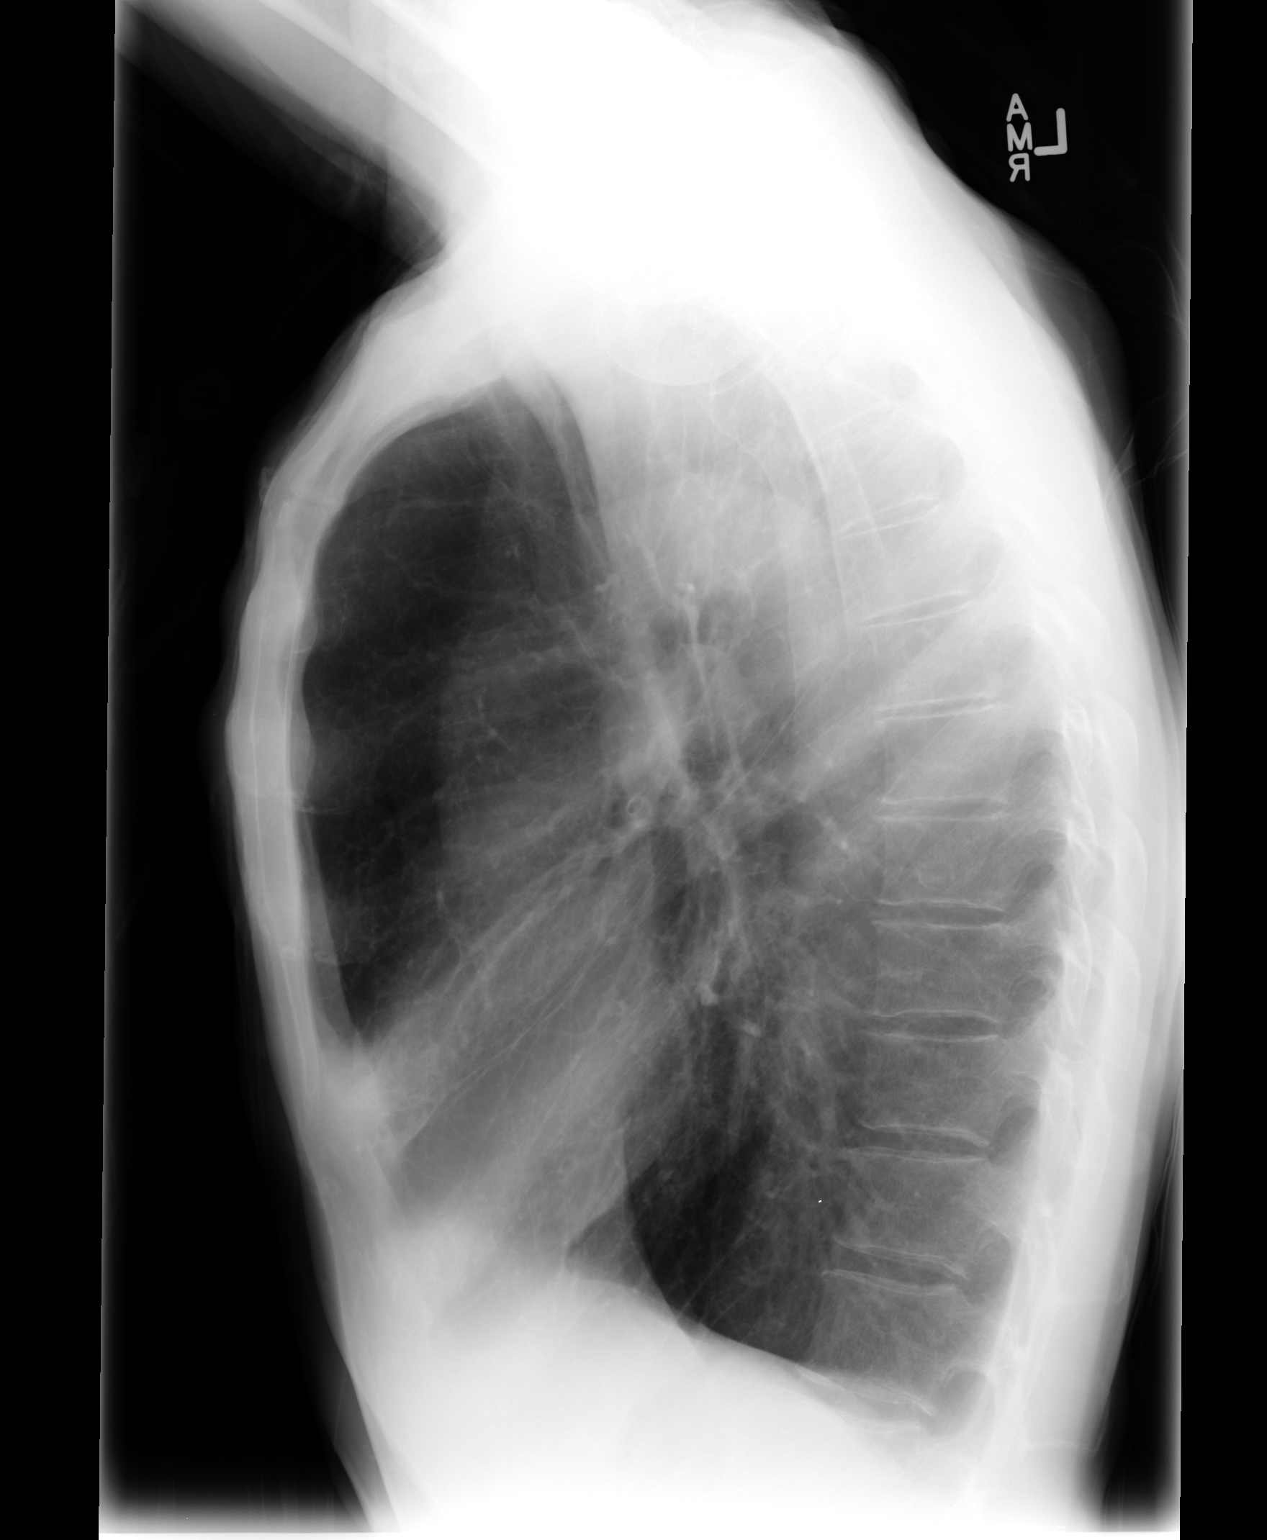

[2 of 2 positions shown; findings below may reference images not displayed]

FINDINGS: Marked COPD with hyperinflation and emphysema. Apical scarring
bilaterally.

Negative for pneumonia or mass.  No change from the prior study.
IMPRESSION: COPD.  No superimposed acute abnormality.

## 2017-09-23 ENCOUNTER — Emergency Department (HOSPITAL_COMMUNITY)
Admission: EM | Admit: 2017-09-23 | Discharge: 2017-09-23 | Disposition: A | Discharge status: Home / Self Care | Payer: Medicaid Other | Attending: Emergency Medicine | Admitting: Emergency Medicine

## 2017-09-23 ENCOUNTER — Encounter (HOSPITAL_COMMUNITY): Payer: Self-pay

## 2017-09-23 ENCOUNTER — Emergency Department (HOSPITAL_COMMUNITY): Payer: Medicaid Other

## 2017-09-23 DIAGNOSIS — Z79899 Other long term (current) drug therapy: Secondary | ICD-10-CM | POA: Insufficient documentation

## 2017-09-23 DIAGNOSIS — F17210 Nicotine dependence, cigarettes, uncomplicated: Secondary | ICD-10-CM | POA: Insufficient documentation

## 2017-09-23 DIAGNOSIS — J441 Chronic obstructive pulmonary disease with (acute) exacerbation: Secondary | ICD-10-CM

## 2017-09-23 LAB — CBC WITH DIFFERENTIAL/PLATELET
Basophils Absolute: 0.1 10*3/uL (ref 0.0–0.1)
Basophils Relative: 1 %
EOS PCT: 5 %
Eosinophils Absolute: 0.3 10*3/uL (ref 0.0–0.7)
HCT: 44.9 % (ref 39.0–52.0)
Hemoglobin: 15.3 g/dL (ref 13.0–17.0)
LYMPHS PCT: 25 %
Lymphs Abs: 1.4 10*3/uL (ref 0.7–4.0)
MCH: 30.8 pg (ref 26.0–34.0)
MCHC: 34.1 g/dL (ref 30.0–36.0)
MCV: 90.5 fL (ref 78.0–100.0)
MONO ABS: 0.6 10*3/uL (ref 0.1–1.0)
Monocytes Relative: 11 %
Neutro Abs: 3.1 10*3/uL (ref 1.7–7.7)
Neutrophils Relative %: 58 %
Platelets: 173 10*3/uL (ref 150–400)
RBC: 4.96 MIL/uL (ref 4.22–5.81)
RDW: 13.2 % (ref 11.5–15.5)
WBC: 5.4 10*3/uL (ref 4.0–10.5)

## 2017-09-23 LAB — COMPREHENSIVE METABOLIC PANEL
ALBUMIN: 4.5 g/dL (ref 3.5–5.0)
ALK PHOS: 63 U/L (ref 38–126)
ALT: 10 U/L (ref 0–44)
AST: 20 U/L (ref 15–41)
Anion gap: 9 (ref 5–15)
BUN: 18 mg/dL (ref 8–23)
CO2: 30 mmol/L (ref 22–32)
CREATININE: 1.23 mg/dL (ref 0.61–1.24)
Calcium: 9.3 mg/dL (ref 8.9–10.3)
Chloride: 100 mmol/L (ref 98–111)
GFR calc Af Amer: >60 mL/min (ref >60)
GFR calc non Af Amer: >60 mL/min (ref >60)
GLUCOSE: 118 mg/dL — AB (ref 70–99)
Potassium: 5.2 mmol/L — ABNORMAL HIGH (ref 3.5–5.1)
SODIUM: 139 mmol/L (ref 135–145)
Total Bilirubin: 1.5 mg/dL — ABNORMAL HIGH (ref 0.3–1.2)
Total Protein: 7.6 g/dL (ref 6.5–8.1)

## 2017-09-23 MED ORDER — PREDNISONE 10 MG PO TABS: 20.0000 mg | ORAL_TABLET | Freq: Every day | ORAL | 0 refills | Status: DC

## 2017-09-23 MED ORDER — PREDNISONE 50 MG PO TABS
60.0000 mg | ORAL_TABLET | Freq: Once | ORAL | Status: AC
  Administered 2017-09-23: 60 mg via ORAL
  Filled 2017-09-23: qty 1

## 2017-09-23 MED ORDER — DOXYCYCLINE HYCLATE 100 MG PO CAPS: 100.0000 mg | ORAL_CAPSULE | Freq: Two times a day (BID) | ORAL | 0 refills | Status: DC

## 2017-09-23 MED ORDER — IPRATROPIUM-ALBUTEROL 0.5-2.5 (3) MG/3ML IN SOLN
3.0000 mL | Freq: Once | RESPIRATORY_TRACT | Status: AC
  Administered 2017-09-23: 3 mL via RESPIRATORY_TRACT
  Filled 2017-09-23: qty 3

## 2017-09-23 NOTE — ED Notes (Signed)
Pt to xray

## 2017-09-23 NOTE — ED Triage Notes (Signed)
Pt reports cough and sob since Friday.  Reports since then has been very sob with exertion.  Has been taking robitussin for cough.  Reports chest pain with coughing.  Pt says cough is productive at times.  Denies fever.

## 2017-09-23 NOTE — Discharge Instructions (Addendum)
Use your inhaler every 4 hours as needed and follow-up with your doctor next week

## 2017-09-23 NOTE — ED Notes (Signed)
Pt back from x-ray.

## 2017-09-23 NOTE — ED Notes (Signed)
Have paged respiratory  

## 2017-09-23 NOTE — ED Provider Notes (Signed)
**Robert Robert** Robert Robert   CSN: [id number] Arrival date & time: 09/23/17  8099     History   Chief Complaint Chief Complaint  Patient presents with  . Shortness of Breath    HPI Robert Robert is a 62 y.o. male.  Patient complains of shortness of breath.  Mild cough no sputum production.  Patient has history of COPD  The history is provided by the patient. No language interpreter was used.  Shortness of Breath  This is a new problem. The problem occurs continuously.The current episode started more than 2 days ago. The problem has not changed since onset.Pertinent negatives include no fever, no headaches, no cough, no chest pain, no abdominal pain and no rash. Precipitated by: Unknown. Risk factors: COPD. He has tried ipratropium inhalers for the symptoms. He has had prior hospitalizations. He has had prior ED visits. He has had no prior ICU admissions. Associated medical issues include COPD.    Past Medical History:  Diagnosis Date  . Emphysema     Patient Active Problem List   Diagnosis Date Noted  . Pain in joint, shoulder region 10/22/2010  . Muscle weakness (generalized) 10/22/2010  . Rotator cuff syndrome of shoulder and allied disorders 10/22/2010  . Capsulitis of shoulder 10/03/2010  . Bursitis of shoulder, right 10/03/2010  . Epigastric pain 05/06/2010  . Helicobacter pylori (H. pylori) infection 05/06/2010  . Rectal bleed 05/06/2010  . Constipation 05/06/2010  . Rectal prolapse 05/06/2010  . Abnormal abdominal ultrasound 05/06/2010    Past Surgical History:  Procedure Laterality Date  . COLONOSCOPY  2002   internal hemorrhoids  . TONSILLECTOMY          Home Medications    Prior to Admission medications   Medication Sig Start Date End Date Taking? Authorizing Provider  albuterol (PROVENTIL HFA;VENTOLIN HFA) 108 (90 Base) MCG/ACT inhaler Inhale 1-2 puffs into the lungs every 6 (six) hours as needed for wheezing or shortness of  breath.   Yes [provider]  guaiFENesin (ROBITUSSIN) 100 MG/5ML liquid Take 400 mg by mouth 3 (three) times daily as needed for cough.   Yes [provider]  doxycycline (VIBRAMYCIN) 100 MG capsule Take 1 capsule (100 mg total) by mouth 2 (two) times daily. One po bid x 7 days 09/23/17   Milton Ferguson, MD  predniSONE (DELTASONE) 10 MG tablet Take 2 tablets (20 mg total) by mouth daily. 09/23/17   Milton Ferguson, MD    Family History Family History  Problem Relation Age of Onset  . Lung cancer Father        deceased  . Heart disease Mother        heart valve replacement  . Colon cancer Neg Hx   . Liver disease Neg Hx     Social History Social History   Tobacco Use  . Smoking status: Current Every Day Smoker    Packs/day: 1.00    Years: 29.00    Pack years: 29.00    Types: Cigarettes  . Smokeless tobacco: Never Used  Substance Use Topics  . Alcohol use: No  . Drug use: No     Allergies   Penicillins   Review of Systems Review of Systems  Constitutional: Negative for appetite change, fatigue and fever.  HENT: Negative for congestion, ear discharge and sinus pressure.   Eyes: Negative for discharge.  Respiratory: Positive for shortness of breath. Negative for cough.   Cardiovascular: Negative for chest pain.  Gastrointestinal: Negative for abdominal  pain and diarrhea.  Genitourinary: Negative for frequency and hematuria.  Musculoskeletal: Negative for back pain.  Skin: Negative for rash.  Neurological: Negative for seizures and headaches.  Psychiatric/Behavioral: Negative for hallucinations.     Physical Exam Updated Vital Signs BP 129/84   Pulse 66   Temp (!) 97.5 F (36.4 C) (Oral)   Resp 15   Ht 5\' 8"  (1.727 m)   Wt 44.5 kg   SpO2 96%   BMI 14.90 kg/m   Physical Exam  Constitutional: He is oriented to person, place, and time. He appears well-developed.  HENT:  Head: Normocephalic.  Eyes: Conjunctivae and EOM are normal. No scleral  icterus.  Neck: Neck supple. No thyromegaly present.  Cardiovascular: Normal rate and regular rhythm. Exam reveals no gallop and no friction rub.  No murmur heard. Pulmonary/Chest: No stridor. He has wheezes. He has no rales. He exhibits no tenderness.  Abdominal: He exhibits no distension. There is no tenderness. There is no rebound.  Musculoskeletal: Normal range of motion. He exhibits no edema.  Lymphadenopathy:    He has no cervical adenopathy.  Neurological: He is oriented to person, place, and time. He exhibits normal muscle tone. Coordination normal.  Skin: No rash noted. No erythema.  Psychiatric: He has a normal mood and affect. His behavior is normal.    EKG Interpretation  Date/Time:  Wednesday September 23 2017 07:41:27 EDT Ventricular Rate:  73 PR Interval:    QRS Duration: 112 QT Interval:  427 QTC Calculation: 471 R Axis:   87 Text Interpretation:  Sinus rhythm RAE, consider biatrial enlargement Anterior infarct, old Confirmed by Milton Ferguson [id number]) on 09/23/2017 10:13:28 AM         ED Treatments / Results  Labs (all labs ordered are listed, but only abnormal results are displayed) Labs Reviewed  COMPREHENSIVE METABOLIC PANEL - Abnormal; Notable for the following components:      Result Value   Potassium 5.2 (*)    Glucose, Bld 118 (*)    Total Bilirubin 1.5 (*)    All other components within normal limits  CBC WITH DIFFERENTIAL/PLATELET    EKG None  Radiology Dg Chest 2 View  Result Date: 09/23/2017 CLINICAL DATA:  Cough and shortness of breath EXAM: CHEST - 2 VIEW COMPARISON:  November 17, 2013 FINDINGS: Lungs are hyperexpanded with prominence of the retrosternal clear space. There are scattered areas of scarring bilaterally. There is no edema or consolidation. Heart appears rather small consistent with the underlying COPD. Cardiac silhouette is stable. Pulmonary vascularity is within normal limits. No adenopathy. No bone lesions. IMPRESSION: Underlying  emphysematous change/COPD. Scattered areas of scarring. No edema or consolidation. Stable cardiac silhouette. Emphysema (ICD10-J43.9). Electronically Signed   By: Lowella Grip III M.D.   On: 09/23/2017 08:39    Procedures Procedures (including critical care time)  Medications Ordered in ED Medications  ipratropium-albuterol (DUONEB) 0.5-2.5 (3) MG/3ML nebulizer solution 3 mL (3 mLs Nebulization Given 09/23/17 0823)  predniSONE (DELTASONE) tablet 60 mg (60 mg Oral Given 09/23/17 0754)     Initial Impression / Assessment and Plan / ED Course  I have reviewed the triage vital signs and the nursing notes.  Pertinent labs & imaging results that were available during my care of the patient were reviewed by me and considered in my medical decision making (see chart for details).     Patient with COPD exacerbation.  Patient will continue his inhaler and is put on prednisone doxycycline and follow-up with PCP Final Clinical  Impressions(s) / ED Diagnoses   Final diagnoses:  COPD exacerbation [facility])    ED Discharge Orders         Ordered    predniSONE (DELTASONE) 10 MG tablet  Daily     09/23/17 1009    doxycycline (VIBRAMYCIN) 100 MG capsule  2 times daily     09/23/17 1009           Milton Ferguson, MD 09/23/17 1013

## 2017-09-23 NOTE — ED Notes (Signed)
Pt stating he feels much better

## 2017-09-23 NOTE — ED Notes (Signed)
Respiratory in room.

## 2017-11-04 ENCOUNTER — Emergency Department (HOSPITAL_COMMUNITY)
Admission: EM | Admit: 2017-11-04 | Discharge: 2017-11-04 | Disposition: A | Discharge status: Home / Self Care | Payer: Medicaid Other | Attending: Emergency Medicine | Admitting: Emergency Medicine

## 2017-11-04 ENCOUNTER — Emergency Department (HOSPITAL_COMMUNITY): Payer: Medicaid Other

## 2017-11-04 ENCOUNTER — Other Ambulatory Visit: Payer: Self-pay

## 2017-11-04 ENCOUNTER — Encounter (HOSPITAL_COMMUNITY): Payer: Self-pay | Admitting: Emergency Medicine

## 2017-11-04 DIAGNOSIS — Z87891 Personal history of nicotine dependence: Secondary | ICD-10-CM | POA: Insufficient documentation

## 2017-11-04 DIAGNOSIS — J441 Chronic obstructive pulmonary disease with (acute) exacerbation: Secondary | ICD-10-CM

## 2017-11-04 DIAGNOSIS — Z79899 Other long term (current) drug therapy: Secondary | ICD-10-CM | POA: Insufficient documentation

## 2017-11-04 DIAGNOSIS — I509 Heart failure, unspecified: Secondary | ICD-10-CM | POA: Insufficient documentation

## 2017-11-04 HISTORY — DX: Heart failure, unspecified: I50.9

## 2017-11-04 HISTORY — DX: Unspecified osteoarthritis, unspecified site: M19.90

## 2017-11-04 MED ORDER — IPRATROPIUM-ALBUTEROL 0.5-2.5 (3) MG/3ML IN SOLN
3.0000 mL | Freq: Once | RESPIRATORY_TRACT | Status: AC
  Administered 2017-11-04: 3 mL via RESPIRATORY_TRACT

## 2017-11-04 MED ORDER — PREDNISONE 10 MG PO TABS: 10.0000 mg | ORAL_TABLET | Freq: Every day | ORAL | 0 refills | Status: DC

## 2017-11-04 MED ORDER — DEXAMETHASONE SODIUM PHOSPHATE 4 MG/ML IJ SOLN
8.0000 mg | Freq: Once | INTRAMUSCULAR | Status: AC
  Administered 2017-11-04: 8 mg via INTRAMUSCULAR
  Filled 2017-11-04: qty 2

## 2017-11-04 NOTE — ED Provider Notes (Signed)
[facility] EMERGENCY DEPARTMENT Provider Note   CSN: [id number] Arrival date & time: 11/04/17  2158     History   Chief Complaint Chief Complaint  Patient presents with  . Shortness of Breath    HPI Robert Duarte is a 62 y.o. male.  Patient with a known history of COPD presents with worsening cough and dyspnea over the past couple days.  He quit smoking last month.  Cough productive for yellow sputum.  No fever, sweats, chills.  Severity of symptoms is moderate.  He has used an albuterol MDI at home with minimal success.     Past Medical History:  Diagnosis Date  . Arthritis   . CHF (congestive heart failure) ([place])   . Emphysema     Patient Active Problem List   Diagnosis Date Noted  . Pain in joint, shoulder region 10/22/2010  . Muscle weakness (generalized) 10/22/2010  . Rotator cuff syndrome of shoulder and allied disorders 10/22/2010  . Capsulitis of shoulder 10/03/2010  . Bursitis of shoulder, right 10/03/2010  . Epigastric pain 05/06/2010  . Helicobacter pylori (H. pylori) infection 05/06/2010  . Rectal bleed 05/06/2010  . Constipation 05/06/2010  . Rectal prolapse 05/06/2010  . Abnormal abdominal ultrasound 05/06/2010    Past Surgical History:  Procedure Laterality Date  . COLONOSCOPY  2002   internal hemorrhoids  . TONSILLECTOMY          Home Medications    Prior to Admission medications   Medication Sig Start Date End Date Taking? Authorizing Provider  albuterol (PROVENTIL HFA;VENTOLIN HFA) 108 (90 Base) MCG/ACT inhaler Inhale 1-2 puffs into the lungs every 6 (six) hours as needed for wheezing or shortness of breath.    [provider]  doxycycline (VIBRAMYCIN) 100 MG capsule Take 1 capsule (100 mg total) by mouth 2 (two) times daily. One po bid x 7 days 09/23/17   Milton Ferguson, MD  guaiFENesin (ROBITUSSIN) 100 MG/5ML liquid Take 400 mg by mouth 3 (three) times daily as needed for cough.    [provider]  predniSONE  (DELTASONE) 10 MG tablet Take 2 tablets (20 mg total) by mouth daily. 09/23/17   Milton Ferguson, MD    Family History Family History  Problem Relation Age of Onset  . Lung cancer Father        deceased  . Heart disease Mother        heart valve replacement  . Colon cancer Neg Hx   . Liver disease Neg Hx     Social History Social History   Tobacco Use  . Smoking status: Former Smoker    Packs/day: 1.00    Years: 29.00    Pack years: 29.00    Types: Cigarettes    Last attempt to quit: 09/20/2017    Years since quitting: 0.1  . Smokeless tobacco: Never Used  Substance Use Topics  . Alcohol use: No  . Drug use: No     Allergies   Penicillins   Review of Systems Review of Systems  All other systems reviewed and are negative.    Physical Exam Updated Vital Signs BP (!) 151/97 (BP Location: Right Arm)   Pulse 82   Temp 97.6 F (36.4 C) (Oral)   Resp 19   Ht 5\' 8"  (1.727 m)   Wt 44.5 kg   SpO2 98%   BMI 14.90 kg/m   Physical Exam  Constitutional: He is oriented to person, place, and time.  Not in respiratory extremis  HENT:  Head: Normocephalic and atraumatic.  Eyes: Conjunctivae are normal.  Neck: Neck supple.  Cardiovascular: Normal rate and regular rhythm.  Pulmonary/Chest: He has wheezes.  Slight tachypnea; using accessory muscles; bilateral expiratory wheeze  Abdominal: Soft. Bowel sounds are normal.  Musculoskeletal: Normal range of motion.  Neurological: He is alert and oriented to person, place, and time.  Skin: Skin is warm and dry.  Psychiatric: He has a normal mood and affect. His behavior is normal.  Nursing note and vitals reviewed.    ED Treatments / Results  Labs (all labs ordered are listed, but only abnormal results are displayed) Labs Reviewed - No data to display  EKG None  Radiology No results found.  Procedures Procedures (including critical care time)  Medications Ordered in ED Medications  ipratropium-albuterol  (DUONEB) 0.5-2.5 (3) MG/3ML nebulizer solution 3 mL (has no administration in time range)  dexamethasone (DECADRON) injection 8 mg (has no administration in time range)     Initial Impression / Assessment and Plan / ED Course  I have reviewed the triage vital signs and the nursing notes.  Pertinent labs & imaging results that were available during my care of the patient were reviewed by me and considered in my medical decision making (see chart for details).    Patient with a known history of COPD presents with an exacerbation of same.  DuoNeb nebulizer treatment, IM Decadron, chest x-ray.   Final Clinical Impressions(s) / ED Diagnoses   Final diagnoses:  COPD exacerbation [facility])    ED Discharge Orders    None       Nat Christen, MD 11/04/17 2220

## 2017-11-04 NOTE — ED Triage Notes (Signed)
Pt states he has been more sob than usual, hx of COPD. State one hour ago had a hot flash and couldn't catch breath. Pt able to finish sentences and very mild sob noted. Color wnl. C/o prod yellow cough and pain to left chest with coughing.

## 2017-11-04 NOTE — Discharge Instructions (Addendum)
Chest x-ray showed no pneumonia.  Prescription for prednisone to start tomorrow.  Use your inhaler as needed.  Take over-the-counter cough syrup.

## 2018-02-02 ENCOUNTER — Emergency Department (HOSPITAL_COMMUNITY)
Admission: EM | Admit: 2018-02-02 | Discharge: 2018-02-02 | Disposition: A | Discharge status: Home / Self Care | Payer: Self-pay | Attending: Emergency Medicine | Admitting: Emergency Medicine

## 2018-02-02 ENCOUNTER — Emergency Department (HOSPITAL_COMMUNITY): Payer: Self-pay

## 2018-02-02 ENCOUNTER — Encounter (HOSPITAL_COMMUNITY): Payer: Self-pay | Admitting: Emergency Medicine

## 2018-02-02 ENCOUNTER — Other Ambulatory Visit: Payer: Self-pay

## 2018-02-02 DIAGNOSIS — I509 Heart failure, unspecified: Secondary | ICD-10-CM | POA: Insufficient documentation

## 2018-02-02 DIAGNOSIS — E875 Hyperkalemia: Secondary | ICD-10-CM | POA: Insufficient documentation

## 2018-02-02 DIAGNOSIS — J441 Chronic obstructive pulmonary disease with (acute) exacerbation: Secondary | ICD-10-CM | POA: Insufficient documentation

## 2018-02-02 DIAGNOSIS — Z87891 Personal history of nicotine dependence: Secondary | ICD-10-CM | POA: Insufficient documentation

## 2018-02-02 LAB — CBC WITH DIFFERENTIAL/PLATELET
Abs Immature Granulocytes: 0.02 10*3/uL (ref 0.00–0.07)
Basophils Absolute: 0.1 10*3/uL (ref 0.0–0.1)
Basophils Relative: 1 %
Eosinophils Absolute: 0.6 10*3/uL — ABNORMAL HIGH (ref 0.0–0.5)
Eosinophils Relative: 10 %
HCT: 46 % (ref 39.0–52.0)
Hemoglobin: 14.7 g/dL (ref 13.0–17.0)
Immature Granulocytes: 0 %
Lymphocytes Relative: 16 %
Lymphs Abs: 1 10*3/uL (ref 0.7–4.0)
MCH: 29.8 pg (ref 26.0–34.0)
MCHC: 32 g/dL (ref 30.0–36.0)
MCV: 93.3 fL (ref 80.0–100.0)
Monocytes Absolute: 0.8 10*3/uL (ref 0.1–1.0)
Monocytes Relative: 12 %
Neutro Abs: 3.8 10*3/uL (ref 1.7–7.7)
Neutrophils Relative %: 61 %
Platelets: 203 10*3/uL (ref 150–400)
RBC: 4.93 MIL/uL (ref 4.22–5.81)
RDW: 12.8 % (ref 11.5–15.5)
WBC: 6.3 10*3/uL (ref 4.0–10.5)
nRBC: 0 % (ref 0.0–0.2)

## 2018-02-02 LAB — BASIC METABOLIC PANEL
Anion gap: 11 (ref 5–15)
BUN: 14 mg/dL (ref 8–23)
CO2: 27 mmol/L (ref 22–32)
Calcium: 9.7 mg/dL (ref 8.9–10.3)
Chloride: 100 mmol/L (ref 98–111)
Creatinine, Ser: 1.02 mg/dL (ref 0.61–1.24)
GFR calc Af Amer: >60 mL/min (ref >60)
GFR calc non Af Amer: >60 mL/min (ref >60)
Glucose, Bld: 106 mg/dL — ABNORMAL HIGH (ref 70–99)
Potassium: 5.8 mmol/L — ABNORMAL HIGH (ref 3.5–5.1)
Sodium: 138 mmol/L (ref 135–145)

## 2018-02-02 LAB — BRAIN NATRIURETIC PEPTIDE: B Natriuretic Peptide: 20 pg/mL (ref 0.0–100.0)

## 2018-02-02 LAB — TROPONIN I: Troponin I: <0.03 ng/mL (ref <0.03)

## 2018-02-02 MED ORDER — ALBUTEROL SULFATE (2.5 MG/3ML) 0.083% IN NEBU: 5.0000 mg | INHALATION_SOLUTION | Freq: Once | RESPIRATORY_TRACT | Status: DC

## 2018-02-02 MED ORDER — PREDNISONE 20 MG PO TABS: 40.0000 mg | ORAL_TABLET | Freq: Every day | ORAL | 0 refills | Status: DC

## 2018-02-02 MED ORDER — METHYLPREDNISOLONE SODIUM SUCC 125 MG IJ SOLR
80.0000 mg | Freq: Once | INTRAMUSCULAR | Status: AC
  Administered 2018-02-02: 80 mg via INTRAVENOUS
  Filled 2018-02-02: qty 2

## 2018-02-02 MED ORDER — SODIUM POLYSTYRENE SULFONATE 15 GM/60ML PO SUSP
30.0000 g | Freq: Once | ORAL | Status: AC
  Administered 2018-02-02: 30 g via ORAL
  Filled 2018-02-02: qty 120

## 2018-02-02 MED ORDER — IPRATROPIUM-ALBUTEROL 0.5-2.5 (3) MG/3ML IN SOLN
3.0000 mL | Freq: Once | RESPIRATORY_TRACT | Status: AC
  Administered 2018-02-02: 3 mL via RESPIRATORY_TRACT
  Filled 2018-02-02: qty 3

## 2018-02-02 NOTE — ED Provider Notes (Signed)
[facility] EMERGENCY DEPARTMENT Provider Note   CSN: [id number] Arrival date & time: 02/02/18  1427     History   Chief Complaint Chief Complaint  Patient presents with  . Shortness of Breath    HPI Robert Duarte is a 63 y.o. male.  HPI 63 year old male with shortness of breath.  History of COPD.  He reports worsening shortness of breath since last night.  He states that he had to wake up several times on the night pieces of breathing.  He had a set to catch his breath before he is able to lay down.  His chest feels tight at times.  Increase of nonproductive cough.  No fevers.  No unusual swelling.  Past Medical History:  Diagnosis Date  . Arthritis   . CHF (congestive heart failure) ([place])   . Emphysema     Patient Active Problem List   Diagnosis Date Noted  . Pain in joint, shoulder region 10/22/2010  . Muscle weakness (generalized) 10/22/2010  . Rotator cuff syndrome of shoulder and allied disorders 10/22/2010  . Capsulitis of shoulder 10/03/2010  . Bursitis of shoulder, right 10/03/2010  . Epigastric pain 05/06/2010  . Helicobacter pylori (H. pylori) infection 05/06/2010  . Rectal bleed 05/06/2010  . Constipation 05/06/2010  . Rectal prolapse 05/06/2010  . Abnormal abdominal ultrasound 05/06/2010    Past Surgical History:  Procedure Laterality Date  . COLONOSCOPY  2002   internal hemorrhoids  . TONSILLECTOMY          Home Medications    Prior to Admission medications   Medication Sig Start Date End Date Taking? Authorizing Provider  albuterol (PROVENTIL HFA;VENTOLIN HFA) 108 (90 Base) MCG/ACT inhaler Inhale 1-2 puffs into the lungs every 6 (six) hours as needed for wheezing or shortness of breath.    [provider]  doxycycline (VIBRAMYCIN) 100 MG capsule Take 1 capsule (100 mg total) by mouth 2 (two) times daily. One po bid x 7 days 09/23/17   Milton Ferguson, MD  guaiFENesin (ROBITUSSIN) 100 MG/5ML liquid Take 400 mg by mouth 3 (three)  times daily as needed for cough.    [provider]  predniSONE (DELTASONE) 10 MG tablet Take 1 tablet (10 mg total) by mouth daily with breakfast. 3 tablets for 3 days, 2 tablets for 3 days, 1 tablet for 3 days 11/04/17   Nat Christen, MD    Family History Family History  Problem Relation Age of Onset  . Lung cancer Father        deceased  . Heart disease Mother        heart valve replacement  . Colon cancer Neg Hx   . Liver disease Neg Hx     Social History Social History   Tobacco Use  . Smoking status: Former Smoker    Packs/day: 1.00    Years: 29.00    Pack years: 29.00    Types: Cigarettes    Last attempt to quit: 09/20/2017    Years since quitting: 0.3  . Smokeless tobacco: Never Used  Substance Use Topics  . Alcohol use: No  . Drug use: No     Allergies   Penicillins   Review of Systems Review of Systems  All systems reviewed and negative, other than as noted in HPI.  Physical Exam Updated Vital Signs BP (!) 142/102 (BP Location: Right Arm)   Pulse 93   Temp 97.6 F (36.4 C)   Resp 18   Ht 5\' 8"  (1.727 m)  Wt 47.6 kg   SpO2 96%   BMI 15.97 kg/m   Physical Exam Vitals signs and nursing note reviewed.  Constitutional:      General: He is not in acute distress.    Appearance: He is well-developed.     Comments: Appears frail/chronically ill-appearing but not acutely distressed.  HENT:     Head: Normocephalic and atraumatic.  Eyes:     General:        Right eye: No discharge.        Left eye: No discharge.     Conjunctiva/sclera: Conjunctivae normal.  Neck:     Musculoskeletal: Neck supple.  Cardiovascular:     Rate and Rhythm: Normal rate and regular rhythm.     Heart sounds: Normal heart sounds. No murmur. No friction rub. No gallop.   Pulmonary:     Effort: Pulmonary effort is normal. No respiratory distress.     Breath sounds: Wheezing present.  Abdominal:     General: There is no distension.     Palpations: Abdomen is soft.      Tenderness: There is no abdominal tenderness.  Musculoskeletal:        General: No tenderness.     Comments: Lower extremities symmetric as compared to each other. No calf tenderness. Negative Homan's. No palpable cords.   Skin:    General: Skin is warm and dry.  Neurological:     Mental Status: He is alert.  Psychiatric:        Behavior: Behavior normal.        Thought Content: Thought content normal.      ED Treatments / Results  Labs (all labs ordered are listed, but only abnormal results are displayed) Labs Reviewed  CBC WITH DIFFERENTIAL/PLATELET - Abnormal; Notable for the following components:      Result Value   Eosinophils Absolute 0.6 (*)    All other components within normal limits  BASIC METABOLIC PANEL - Abnormal; Notable for the following components:   Potassium 5.8 (*)    Glucose, Bld 106 (*)    All other components within normal limits  TROPONIN I  BRAIN NATRIURETIC PEPTIDE    EKG EKG Interpretation  Date/Time:  Tuesday February 02 2018 14:45:36 EST Ventricular Rate:  99 PR Interval:  162 QRS Duration: 92 QT Interval:  352 QTC Calculation: 451 R Axis:   94 Text Interpretation:  Normal sinus rhythm Right atrial enlargement Rightward axis Incomplete right bundle branch block Possible Anterior infarct , age undetermined Abnormal ECG Confirmed by Virgel Manifold [phone]) on 02/02/2018 6:21:50 PM   Radiology Dg Chest 2 View  Result Date: 02/02/2018 CLINICAL DATA:  63 year old male with shortness of breath and chest pain since last night. EXAM: CHEST - 2 VIEW COMPARISON:  11/04/2017 and earlier. FINDINGS: Chronic large lung volumes and course interstitial markings suggesting emphysema. Chronic apical scarring with associated elevation of the hila. Other mediastinal contours are within normal limits. Stable tracheal air column. No pneumothorax, pulmonary edema, pleural effusion or acute pulmonary opacity. No acute osseous abnormality identified. IMPRESSION:  Stable chronic lung disease. No acute cardiopulmonary abnormality. Electronically Signed   By: Genevie Ann M.D.   On: 02/02/2018 15:35    Procedures Procedures (including critical care time)  Medications Ordered in ED Medications  ipratropium-albuterol (DUONEB) 0.5-2.5 (3) MG/3ML nebulizer solution 3 mL (has no administration in time range)  sodium polystyrene (KAYEXALATE) 15 GM/60ML suspension 30 g (has no administration in time range)  methylPREDNISolone sodium succinate (SOLU-MEDROL) 125 mg/2 mL  injection 80 mg (80 mg Intravenous Given 02/02/18 1819)     Initial Impression / Assessment and Plan / ED Course  I have reviewed the triage vital signs and the nursing notes.  Pertinent labs & imaging results that were available during my care of the patient were reviewed by me and considered in my medical decision making (see chart for details).     63 year old male with shortness of breath.  Chest pain is atypical for ACS.  Doubt PE, dissection of the emergent process.  Wheezing on exam.  Likely COPD exacerbation.  Continue his home meds.  Will put on a course of steroids.  Oxygen saturations are good.  Work of breathing is not significantly increased.  Hyperkalemia noted.  Relatively mild.  He was given a dose of Kayexalate.  I am unsure as to the exact reason for this.  Renal function is okay.  No obvious offending medications.  Recommended that he have this rechecked within a few days. Final Clinical Impressions(s) / ED Diagnoses   Final diagnoses:  COPD exacerbation ([place])  Hyperkalemia    ED Discharge Orders         Ordered    predniSONE (DELTASONE) 20 MG tablet  Daily     02/02/18 2004           Virgel Manifold, MD 02/10/18 1125

## 2018-02-02 NOTE — ED Triage Notes (Signed)
Pt c/o SOB and chest pain since last night. States we woke up several times last night having to sit on the side of the bed and use his inhaler to catch his breath.

## 2018-02-02 NOTE — Discharge Instructions (Signed)
You potassium level today was a little high. I would like you to have this rechecked in 2-3 days to make sure it is improving. You received a dose of steroids in the ER today. Start taking prednisone tomorrow.

## 2018-03-06 ENCOUNTER — Other Ambulatory Visit: Payer: Self-pay

## 2018-03-06 ENCOUNTER — Encounter (HOSPITAL_COMMUNITY): Payer: Self-pay | Admitting: Emergency Medicine

## 2018-03-06 ENCOUNTER — Emergency Department (HOSPITAL_COMMUNITY): Payer: Self-pay

## 2018-03-06 ENCOUNTER — Emergency Department (HOSPITAL_COMMUNITY)
Admission: EM | Admit: 2018-03-06 | Discharge: 2018-03-06 | Disposition: A | Discharge status: Home / Self Care | Payer: Self-pay | Attending: Emergency Medicine | Admitting: Emergency Medicine

## 2018-03-06 DIAGNOSIS — I509 Heart failure, unspecified: Secondary | ICD-10-CM | POA: Insufficient documentation

## 2018-03-06 DIAGNOSIS — Z87891 Personal history of nicotine dependence: Secondary | ICD-10-CM | POA: Insufficient documentation

## 2018-03-06 DIAGNOSIS — J441 Chronic obstructive pulmonary disease with (acute) exacerbation: Secondary | ICD-10-CM | POA: Insufficient documentation

## 2018-03-06 MED ORDER — ALBUTEROL SULFATE HFA 108 (90 BASE) MCG/ACT IN AERS: 2.0000 | INHALATION_SPRAY | RESPIRATORY_TRACT | 3 refills | Status: DC | PRN

## 2018-03-06 MED ORDER — DOXYCYCLINE HYCLATE 100 MG PO TABS
100.0000 mg | ORAL_TABLET | Freq: Once | ORAL | Status: AC
  Administered 2018-03-06: 100 mg via ORAL
  Filled 2018-03-06: qty 1

## 2018-03-06 MED ORDER — DOXYCYCLINE HYCLATE 100 MG PO CAPS: 100.0000 mg | ORAL_CAPSULE | Freq: Two times a day (BID) | ORAL | 0 refills | Status: DC

## 2018-03-06 MED ORDER — ALBUTEROL SULFATE (5 MG/ML) 0.5% IN NEBU: 2.5000 mg | INHALATION_SOLUTION | Freq: Four times a day (QID) | RESPIRATORY_TRACT | 12 refills | Status: DC | PRN

## 2018-03-06 MED ORDER — PREDNISONE 20 MG PO TABS
40.0000 mg | ORAL_TABLET | Freq: Once | ORAL | Status: AC
  Administered 2018-03-06: 40 mg via ORAL
  Filled 2018-03-06: qty 2

## 2018-03-06 MED ORDER — ALBUTEROL SULFATE (2.5 MG/3ML) 0.083% IN NEBU
5.0000 mg | INHALATION_SOLUTION | Freq: Once | RESPIRATORY_TRACT | Status: AC
  Administered 2018-03-06: 5 mg via RESPIRATORY_TRACT
  Filled 2018-03-06: qty 6

## 2018-03-06 MED ORDER — PREDNISONE 20 MG PO TABS: 40.0000 mg | ORAL_TABLET | Freq: Every day | ORAL | 0 refills | Status: DC

## 2018-03-06 NOTE — ED Provider Notes (Signed)
[facility] EMERGENCY DEPARTMENT Provider Note   CSN: [id number] Arrival date & time: 03/06/18  1716     History   Chief Complaint Chief Complaint  Patient presents with  . Cough    HPI Robert Duarte is a 63 y.o. male.  HPI  63 year old male, known history of emphysema and COPD, had smoked frequently for the last many many years but stopped at September.  He has intermittent episodes where he becomes wheezy short of breath coughing and has discomfort in his chest with coughing.  This occurred again over the last several days after developing a cold and an upper respiratory illness.  He denies fevers but has had some increasing coughing shortness of breath and purulent sputum production.  His wife made him come to the hospital today to get checked because of the sound of his cough.  There is been no swelling of the legs, he does have pain in his chest when he coughs only.  He has been using his albuterol inhaler but states it is almost out.  Last on prednisone 1 month ago.  Past Medical History:  Diagnosis Date  . Arthritis   . CHF (congestive heart failure) ([place])   . Emphysema     Patient Active Problem List   Diagnosis Date Noted  . Pain in joint, shoulder region 10/22/2010  . Muscle weakness (generalized) 10/22/2010  . Rotator cuff syndrome of shoulder and allied disorders 10/22/2010  . Capsulitis of shoulder 10/03/2010  . Bursitis of shoulder, right 10/03/2010  . Epigastric pain 05/06/2010  . Helicobacter pylori (H. pylori) infection 05/06/2010  . Rectal bleed 05/06/2010  . Constipation 05/06/2010  . Rectal prolapse 05/06/2010  . Abnormal abdominal ultrasound 05/06/2010    Past Surgical History:  Procedure Laterality Date  . COLONOSCOPY  2002   internal hemorrhoids  . TONSILLECTOMY          Home Medications    Prior to Admission medications   Medication Sig Start Date End Date Taking? Authorizing Provider  albuterol (PROVENTIL HFA;VENTOLIN HFA) 108  (90 Base) MCG/ACT inhaler Inhale 2 puffs into the lungs every 4 (four) hours as needed for wheezing or shortness of breath. 03/06/18   Noemi Chapel, MD  albuterol (PROVENTIL) (5 MG/ML) 0.5% nebulizer solution Take 0.5 mLs (2.5 mg total) by nebulization every 6 (six) hours as needed for wheezing or shortness of breath. 03/06/18   Noemi Chapel, MD  doxycycline (VIBRAMYCIN) 100 MG capsule Take 1 capsule (100 mg total) by mouth 2 (two) times daily. 03/06/18   Noemi Chapel, MD  predniSONE (DELTASONE) 20 MG tablet Take 2 tablets (40 mg total) by mouth daily. 03/06/18   Noemi Chapel, MD    Family History Family History  Problem Relation Age of Onset  . Lung cancer Father        deceased  . Heart disease Mother        heart valve replacement  . Colon cancer Neg Hx   . Liver disease Neg Hx     Social History Social History   Tobacco Use  . Smoking status: Former Smoker    Packs/day: 1.00    Years: 29.00    Pack years: 29.00    Types: Cigarettes    Last attempt to quit: 09/20/2017    Years since quitting: 0.4  . Smokeless tobacco: Never Used  Substance Use Topics  . Alcohol use: No  . Drug use: No     Allergies   Penicillins and Yellow jacket venom [bee  venom]   Review of Systems Review of Systems  All other systems reviewed and are negative.    Physical Exam Updated Vital Signs BP 106/73 Comment: recheck  Pulse (!) 108   Temp 98.2 F (36.8 C) (Oral)   Resp 18   Ht 1.727 m (5\' 8" )   Wt 46.3 kg   BMI 15.51 kg/m   Physical Exam Vitals signs and nursing note reviewed.  Constitutional:      General: He is not in acute distress.    Appearance: He is well-developed.  HENT:     Head: Normocephalic and atraumatic.     Comments: Oropharynx clear and moist, dentition is absent, nasal passages are clear, no trismus or torticollis, normal phonation    Mouth/Throat:     Pharynx: No oropharyngeal exudate.  Eyes:     General: No scleral icterus.       Right eye: No  discharge.        Left eye: No discharge.     Conjunctiva/sclera: Conjunctivae normal.     Pupils: Pupils are equal, round, and reactive to light.  Neck:     Musculoskeletal: Normal range of motion and neck supple.     Thyroid: No thyromegaly.     Vascular: No JVD.  Cardiovascular:     Rate and Rhythm: Regular rhythm. Tachycardia present.     Heart sounds: Normal heart sounds. No murmur. No friction rub. No gallop.      Comments: Heart rate of approximately 105 bpm Pulmonary:     Effort: Pulmonary effort is normal. No respiratory distress.     Breath sounds: Wheezing present. No rhonchi or rales.     Comments: Diffuse expiratory wheezing with a mild prolonged expiratory phase but speaks in full sentences without increased work of breathing. Chest:     Chest wall: No tenderness.  Abdominal:     General: Bowel sounds are normal. There is no distension.     Palpations: Abdomen is soft. There is no mass.     Tenderness: There is no abdominal tenderness.  Musculoskeletal: Normal range of motion.        General: No tenderness.  Lymphadenopathy:     Cervical: No cervical adenopathy.  Skin:    General: Skin is warm and dry.     Findings: No erythema or rash.  Neurological:     Mental Status: He is alert.     Coordination: Coordination normal.  Psychiatric:        Behavior: Behavior normal.      ED Treatments / Results  Labs (all labs ordered are listed, but only abnormal results are displayed) Labs Reviewed - No data to display  EKG None  Radiology Dg Chest 2 View  Result Date: 03/06/2018 CLINICAL DATA:  Nonproductive cough for 3 weeks. EXAM: CHEST - 2 VIEW COMPARISON:  February 02, 2018 FINDINGS: Hyperinflation lungs consistent with emphysema. The heart, hila, and mediastinum are unremarkable. No nodules or masses. No focal infiltrates. IMPRESSION: Emphysema with hyperinflation of the lungs. No other acute abnormalities or changes. Electronically Signed   By: Dorise Bullion  III M.D   On: 03/06/2018 18:18    Procedures Procedures (including critical care time)  Medications Ordered in ED Medications  albuterol (PROVENTIL) (2.5 MG/3ML) 0.083% nebulizer solution 5 mg (has no administration in time range)  predniSONE (DELTASONE) tablet 40 mg (has no administration in time range)  doxycycline (VIBRA-TABS) tablet 100 mg (has no administration in time range)     Initial Impression /  Assessment and Plan / ED Course  I have reviewed the triage vital signs and the nursing notes.  Pertinent labs & imaging results that were available during my care of the patient were reviewed by me and considered in my medical decision making (see chart for details).     I have personally looked at the chest x-ray which shows hyperexpansion of the bilateral lungs without obvious fractures and no pneumothorax or infiltrate.  Patient will be treated for COPD exacerbation with increased amounts of sputum and purulent material.  Will treat with doxycycline, albuterol, prednisone, patient agreeable.  He will get a dose of albuterol nebulizer here.  Stable for discharge otherwise with no hypoxia hypotension or fever.  Vitals:   03/06/18 1721 03/06/18 1723  BP: (!) 150/128 106/73  Pulse: (!) 106 (!) 108  Resp: 18   Temp: 98.2 F (36.8 C)   TempSrc: Oral   Weight:  46.3 kg  Height:  1.727 m (5\' 8" )     Final Clinical Impressions(s) / ED Diagnoses   Final diagnoses:  COPD exacerbation [facility])    ED Discharge Orders         Ordered    doxycycline (VIBRAMYCIN) 100 MG capsule  2 times daily     03/06/18 1821    albuterol (PROVENTIL) (5 MG/ML) 0.5% nebulizer solution  Every 6 hours PRN     03/06/18 1821    predniSONE (DELTASONE) 20 MG tablet  Daily     03/06/18 1821    albuterol (PROVENTIL HFA;VENTOLIN HFA) 108 (90 Base) MCG/ACT inhaler  Every 4 hours PRN     03/06/18 1821           Noemi Chapel, MD 03/06/18 1821

## 2018-03-06 NOTE — ED Triage Notes (Signed)
Pt states he has been coughing for "a while" but was worse today and thinks he may have broken some ribs from coughing so hard.  Nonproductive cough.

## 2018-03-06 NOTE — Discharge Instructions (Signed)
Your x-ray shows no signs of pneumonia  Please take the following medications and return to see your doctor within 1 week, emergency department for severe or worsening symptoms  Doxycycline 100mg  by mouth twice daily until the medicine is completely finished - this medicine is a strong antibiotic that treats certain infections.    Please take 2 puffs of albuterol every 4 hours for the first 24 hours, then every 4 hours as needed.  This medicine will help to open up your lungs and allow you breathe easier, cough less and have less tightness in your chest.  This medicine can be taken with you - it is small and portable.    Prednisone is a steroid that helps to reduce certain types of inflammation and may be used for allergic reactions, some rashes such as poison ivy or dermatitis, for asthma attacks or bronchitis and for certain types of pain.  Please take this medicine exactly as prescribed - 40mg  by mouth daily for 5 days.  This can have certain side effects with some people including feeling like you can't sleep, feeling anxious or feeling like you are on a "high".  It should not cause weight gain if only taken for a short time.     Please use albuterol inhaler or nebulizer 2 puffs every 4 hours as needed for shortness of breath or wheezing.  You may use 1 albuterol treatment every 4 hours as needed through your nebulizing machine.  Thank you for letting us take care of you today!  Please obtain all of your results from medical records or have your doctors office obtain the results - share them with your doctor - you should be seen at your doctors office in the next 2 days. Call today to arrange your follow up. Take the medications as prescribed. Please review all of the medicines and only take them if you do not have an allergy to them. Please be aware that if you are taking birth control pills, taking other prescriptions, ESPECIALLY ANTIBIOTICS may make the birth control ineffective - if this is  the case, either do not engage in sexual activity or use alternative methods of birth control such as condoms until you have finished the medicine and your family doctor says it is OK to restart them. If you are on a blood thinner such as COUMADIN, be aware that any other medicine that you take may cause the coumadin to either work too much, or not enough - you should have your coumadin level rechecked in next 7 days if this is the case.  ?  It is also a possibility that you have an allergic reaction to any of the medicines that you have been prescribed - Everybody reacts differently to medications and while MOST people have no trouble with most medicines, you may have a reaction such as nausea, vomiting, rash, swelling, shortness of breath. If this is the case, please stop taking the medicine immediately and contact your physician.   If you were given a medication in the ED such as percocet, vicodin, or morphine, be aware that these medicines are sedating and may change your ability to take care of yourself adequately for several hours after being given this medicines - you should not drive or take care of small children if you were given this medicine in the Emergency Department or if you have been prescribed these types of medicines. ?   You should return to the ER IMMEDIATELY if you develop severe or worsening  symptoms.

## 2018-05-04 ENCOUNTER — Emergency Department (HOSPITAL_COMMUNITY)
Admission: EM | Admit: 2018-05-04 | Discharge: 2018-05-05 | Disposition: A | Discharge status: Home / Self Care | Payer: Medicaid Other | Attending: Emergency Medicine | Admitting: Emergency Medicine

## 2018-05-04 ENCOUNTER — Other Ambulatory Visit: Payer: Self-pay

## 2018-05-04 DIAGNOSIS — Z87891 Personal history of nicotine dependence: Secondary | ICD-10-CM | POA: Insufficient documentation

## 2018-05-04 DIAGNOSIS — I509 Heart failure, unspecified: Secondary | ICD-10-CM | POA: Insufficient documentation

## 2018-05-04 DIAGNOSIS — J441 Chronic obstructive pulmonary disease with (acute) exacerbation: Secondary | ICD-10-CM | POA: Insufficient documentation

## 2018-05-05 ENCOUNTER — Other Ambulatory Visit: Payer: Self-pay

## 2018-05-05 ENCOUNTER — Emergency Department (HOSPITAL_COMMUNITY): Payer: Medicaid Other

## 2018-05-05 ENCOUNTER — Encounter (HOSPITAL_COMMUNITY): Payer: Self-pay | Admitting: Emergency Medicine

## 2018-05-05 LAB — BASIC METABOLIC PANEL
Anion gap: 10 (ref 5–15)
BUN: 14 mg/dL (ref 8–23)
CO2: 30 mmol/L (ref 22–32)
Calcium: 9.6 mg/dL (ref 8.9–10.3)
Chloride: 99 mmol/L (ref 98–111)
Creatinine, Ser: 1.02 mg/dL (ref 0.61–1.24)
GFR calc Af Amer: >60 mL/min (ref >60)
GFR calc non Af Amer: >60 mL/min (ref >60)
Glucose, Bld: 124 mg/dL — ABNORMAL HIGH (ref 70–99)
Potassium: 4.2 mmol/L (ref 3.5–5.1)
Sodium: 139 mmol/L (ref 135–145)

## 2018-05-05 LAB — CBC
HCT: 51.8 % (ref 39.0–52.0)
Hemoglobin: 17 g/dL (ref 13.0–17.0)
MCH: 30.4 pg (ref 26.0–34.0)
MCHC: 32.8 g/dL (ref 30.0–36.0)
MCV: 92.5 fL (ref 80.0–100.0)
Platelets: 188 10*3/uL (ref 150–400)
RBC: 5.6 MIL/uL (ref 4.22–5.81)
RDW: 13 % (ref 11.5–15.5)
WBC: 7.5 10*3/uL (ref 4.0–10.5)
nRBC: 0 % (ref 0.0–0.2)

## 2018-05-05 LAB — D-DIMER, QUANTITATIVE (NOT AT ARMC): D-Dimer, Quant: 0.5 ug/mL-FEU (ref 0.00–0.50)

## 2018-05-05 LAB — TROPONIN I
Troponin I: <0.03 ng/mL (ref <0.03)
Troponin I: <0.03 ng/mL (ref <0.03)

## 2018-05-05 LAB — BRAIN NATRIURETIC PEPTIDE: B Natriuretic Peptide: 25 pg/mL (ref 0.0–100.0)

## 2018-05-05 MED ORDER — MAGNESIUM SULFATE 2 GM/50ML IV SOLN
2.0000 g | Freq: Once | INTRAVENOUS | Status: AC
  Administered 2018-05-05: 2 g via INTRAVENOUS
  Filled 2018-05-05: qty 50

## 2018-05-05 MED ORDER — ALBUTEROL SULFATE (5 MG/ML) 0.5% IN NEBU: 2.5000 mg | INHALATION_SOLUTION | Freq: Four times a day (QID) | RESPIRATORY_TRACT | 0 refills | Status: DC | PRN

## 2018-05-05 MED ORDER — DOXYCYCLINE HYCLATE 100 MG PO CAPS: 100.0000 mg | ORAL_CAPSULE | Freq: Two times a day (BID) | ORAL | 0 refills | Status: DC

## 2018-05-05 MED ORDER — METHYLPREDNISOLONE SODIUM SUCC 125 MG IJ SOLR
125.0000 mg | Freq: Once | INTRAMUSCULAR | Status: AC
  Administered 2018-05-05: 125 mg via INTRAVENOUS
  Filled 2018-05-05: qty 2

## 2018-05-05 MED ORDER — PREDNISONE 50 MG PO TABS: ORAL_TABLET | ORAL | 0 refills | Status: DC

## 2018-05-05 MED ORDER — ALBUTEROL SULFATE HFA 108 (90 BASE) MCG/ACT IN AERS
INHALATION_SPRAY | RESPIRATORY_TRACT | Status: AC
  Filled 2018-05-05: qty 6.7

## 2018-05-05 MED ORDER — ALBUTEROL SULFATE HFA 108 (90 BASE) MCG/ACT IN AERS
2.0000 | INHALATION_SPRAY | RESPIRATORY_TRACT | Status: DC | PRN
  Administered 2018-05-05: 2 via RESPIRATORY_TRACT

## 2018-05-05 MED ORDER — ALBUTEROL SULFATE (2.5 MG/3ML) 0.083% IN NEBU: 5.0000 mg | INHALATION_SOLUTION | Freq: Once | RESPIRATORY_TRACT | Status: DC

## 2018-05-05 NOTE — ED Notes (Signed)
Pt ambulated around nursing station with no distress noted. 94% O2 while ambulating.

## 2018-05-05 NOTE — ED Triage Notes (Signed)
Pt c/o SOB after mowing the yard this afternoon. Thinks its related to COPD

## 2018-05-05 NOTE — Discharge Instructions (Signed)
Take the steroids and antibiotics as prescribed.  You were given a prescription for nebulizer machine which you may use instead of your inhaler for difficulty breathing.  Follow-up with your doctor.  Return to the ED with chest pain, shortness of breath or other concerns.

## 2018-05-05 NOTE — ED Provider Notes (Signed)
[facility] EMERGENCY DEPARTMENT Provider Note   CSN: [id number] Arrival date & time: 05/04/18  2354    History   Chief Complaint Chief Complaint  Patient presents with   Shortness of Breath    HPI Robert Duarte is a 63 y.o. male.     Patient with history of COPD, previous smoker presenting with difficulty breathing, cough and chest tightness that onset today after he was mowing the lawn.  States he felt well prior to this.  Believes he was exposed to pollen when mowing the lawn.  States he uses albuterol inhaler about 4 times at home.  He is not on any other medications for his breathing.  He uses albuterol only on a as needed basis and has not been admitted overnight for his breathing.  He complains of tightness in his chest and cough productive of clear mucus.  Denies any sore throat or runny nose.  No leg pain or leg swelling.  States history of CHF but does not take diuretics at home. No fevers chills, nausea or vomiting.  Has had similar chest tightness in the past with his COPD exacerbations.  States he was wearing a mask while mowing the lawn.  The history is provided by the patient.  Shortness of Breath  Associated symptoms: cough   Associated symptoms: no abdominal pain, no chest pain, no fever, no rash and no vomiting     Past Medical History:  Diagnosis Date   Arthritis    CHF (congestive heart failure) ([place])    Emphysema     Patient Active Problem List   Diagnosis Date Noted   Pain in joint, shoulder region 10/22/2010   Muscle weakness (generalized) 10/22/2010   Rotator cuff syndrome of shoulder and allied disorders 10/22/2010   Capsulitis of shoulder 10/03/2010   Bursitis of shoulder, right 10/03/2010   Epigastric pain 01/22/7251   Helicobacter pylori (H. pylori) infection 05/06/2010   Rectal bleed 05/06/2010   Constipation 05/06/2010   Rectal prolapse 05/06/2010   Abnormal abdominal ultrasound 05/06/2010    Past Surgical History:    Procedure Laterality Date   COLONOSCOPY  2002   internal hemorrhoids   TONSILLECTOMY          Home Medications    Prior to Admission medications   Medication Sig Start Date End Date Taking? Authorizing Provider  albuterol (PROVENTIL HFA;VENTOLIN HFA) 108 (90 Base) MCG/ACT inhaler Inhale 2 puffs into the lungs every 4 (four) hours as needed for wheezing or shortness of breath. 03/06/18   Noemi Chapel, MD  albuterol (PROVENTIL) (5 MG/ML) 0.5% nebulizer solution Take 0.5 mLs (2.5 mg total) by nebulization every 6 (six) hours as needed for wheezing or shortness of breath. 03/06/18   Noemi Chapel, MD  doxycycline (VIBRAMYCIN) 100 MG capsule Take 1 capsule (100 mg total) by mouth 2 (two) times daily. 03/06/18   Noemi Chapel, MD  predniSONE (DELTASONE) 20 MG tablet Take 2 tablets (40 mg total) by mouth daily. 03/06/18   Noemi Chapel, MD    Family History Family History  Problem Relation Age of Onset   Lung cancer Father        deceased   Heart disease Mother        heart valve replacement   Colon cancer Neg Hx    Liver disease Neg Hx     Social History Social History   Tobacco Use   Smoking status: Former Smoker    Packs/day: 1.00    Years: 29.00  Pack years: 29.00    Types: Cigarettes    Last attempt to quit: 09/20/2017    Years since quitting: 0.6   Smokeless tobacco: Never Used  Substance Use Topics   Alcohol use: No   Drug use: No     Allergies   Penicillins and Yellow jacket venom [bee venom]   Review of Systems Review of Systems  Constitutional: Negative for activity change, appetite change and fever.  HENT: Positive for congestion. Negative for rhinorrhea.   Respiratory: Positive for cough, chest tightness and shortness of breath.   Cardiovascular: Negative for chest pain.  Gastrointestinal: Negative for abdominal pain, nausea and vomiting.  Genitourinary: Negative for dysuria and hematuria.  Musculoskeletal: Negative for arthralgias and  myalgias.  Skin: Negative for rash.  Neurological: Negative for dizziness and weakness.    all other systems are negative except as noted in the HPI and PMH.    Physical Exam Updated Vital Signs BP (!) 165/105    Pulse (!) 109    Temp 98.1 F (36.7 C) (Oral)    Resp 20    Ht 5\' 8"  (1.727 m)    Wt 46.7 kg    SpO2 97%    BMI 15.66 kg/m   Physical Exam Vitals signs and nursing note reviewed.  Constitutional:      General: He is in acute distress.     Appearance: Normal appearance. He is well-developed and normal weight. He is not ill-appearing.     Comments: Mildly increased work of breathing, dyspneic with conversation  HENT:     Head: Normocephalic and atraumatic.     Mouth/Throat:     Pharynx: No oropharyngeal exudate.  Eyes:     Conjunctiva/sclera: Conjunctivae normal.     Pupils: Pupils are equal, round, and reactive to light.  Neck:     Musculoskeletal: Normal range of motion and neck supple.     Comments: No meningismus. Cardiovascular:     Rate and Rhythm: Normal rate and regular rhythm.     Heart sounds: Normal heart sounds. No murmur.  Pulmonary:     Effort: Respiratory distress present.     Breath sounds: Wheezing present.     Comments: Dyspneic with conversation, diminished breath sounds throughout Abdominal:     Palpations: Abdomen is soft.     Tenderness: There is no abdominal tenderness. There is no guarding or rebound.  Musculoskeletal: Normal range of motion.        General: No tenderness.  Skin:    General: Skin is warm.     Capillary Refill: Capillary refill takes less than 2 seconds.  Neurological:     General: No focal deficit present.     Mental Status: He is alert and oriented to person, place, and time. Mental status is at baseline.     Cranial Nerves: No cranial nerve deficit.     Motor: No abnormal muscle tone.     Coordination: Coordination normal.     Comments: No ataxia on finger to nose bilaterally. No pronator drift. 5/5 strength  throughout. CN 2-12 intact.Equal grip strength. Sensation intact.   Psychiatric:        Behavior: Behavior normal.      ED Treatments / Results  Labs (all labs ordered are listed, but only abnormal results are displayed) Labs Reviewed  BASIC METABOLIC PANEL - Abnormal; Notable for the following components:      Result Value   Glucose, Bld 124 (*)    All other components within normal limits  CBC  TROPONIN I  BRAIN NATRIURETIC PEPTIDE  D-DIMER, QUANTITATIVE (NOT AT [facility])  TROPONIN I    EKG EKG Interpretation  Date/Time:  Wednesday May 05 2018 00:55:14 EDT Ventricular Rate:  100 PR Interval:    QRS Duration: 95 QT Interval:  355 QTC Calculation: 458 R Axis:   67 Text Interpretation:  Sinus tachycardia Right atrial enlargement RSR' in V1 or V2, probably normal variant No significant change was found Confirmed by Ezequiel Essex [phone]) on 05/05/2018 12:58:41 AM   Radiology Dg Chest Portable 1 View  Result Date: 05/05/2018 CLINICAL DATA:  Shortness of breath following exertion EXAM: PORTABLE CHEST 1 VIEW COMPARISON:  03/06/2018 FINDINGS: Cardiac shadows within normal limits. The lungs are hyperinflated consistent with COPD. No focal infiltrate or sizable effusion is noted. No bony abnormality is seen. IMPRESSION: COPD without acute abnormality. Electronically Signed   By: Inez Catalina M.D.   On: 05/05/2018 01:35    Procedures Procedures (including critical care time)  Medications Ordered in ED Medications  albuterol (PROVENTIL HFA;VENTOLIN HFA) 108 (90 Base) MCG/ACT inhaler (  Canceled Entry 05/05/18 0017)  albuterol (PROVENTIL HFA;VENTOLIN HFA) 108 (90 Base) MCG/ACT inhaler 2 puff (2 puffs Inhalation Given 05/05/18 0017)  magnesium sulfate IVPB 2 g 50 mL (has no administration in time range)  methylPREDNISolone sodium succinate (SOLU-MEDROL) 125 mg/2 mL injection 125 mg (has no administration in time range)     Initial Impression / Assessment and Plan / ED Course  I  have reviewed the triage vital signs and the nursing notes.  Pertinent labs & imaging results that were available during my care of the patient were reviewed by me and considered in my medical decision making (see chart for details).       COPD exacerbation after mowing the lawn now with difficulty breathing and coughing.  EKG is unchanged sinus rhythm.  Diminished breath sounds on exam.  Patient was given bronchodilators and steroids.  EKG is abnormal but unchanged.  Troponin is negative.  Chest x-ray is negative.  D-dimer is negative.  Patient feels much improved after nebulizer and steroids.  He is ambulatory without desaturation.  Denies chest pain or shortness of breath.  Exchanging air well on reassessment.  Suspect COPD exacerbation but cannot rule out coronavirus infection.  Patient agrees to self isolate at home.  Will treat with bronchodilators and steroids.  Return precautions discussed including chest pain, shortness of breath, any other concerns.  Prescription for nebulizer given.  Continue steroids and antibiotics.  Robert Duarte was evaluated in Emergency Department on 05/05/2018 for the symptoms described in the history of present illness. He was evaluated in the context of the global COVID-19 pandemic, which necessitated consideration that the patient might be at risk for infection with the SARS-CoV-2 virus that causes COVID-19. Institutional protocols and algorithms that pertain to the evaluation of patients at risk for COVID-19 are in a state of rapid change based on information released by regulatory bodies including the CDC and federal and state organizations. These policies and algorithms were followed during the patient's care in the ED.   Final Clinical Impressions(s) / ED Diagnoses   Final diagnoses:  COPD exacerbation [facility])    ED Discharge Orders    None       Morrill Bomkamp, Annie Main, MD 05/05/18 [phone]

## 2018-11-23 ENCOUNTER — Other Ambulatory Visit: Payer: Self-pay

## 2018-11-23 DIAGNOSIS — Z20822 Contact with and (suspected) exposure to covid-19: Secondary | ICD-10-CM

## 2018-11-24 LAB — NOVEL CORONAVIRUS, NAA: SARS-CoV-2, NAA: DETECTED — AB

## 2019-06-05 ENCOUNTER — Other Ambulatory Visit: Payer: Self-pay

## 2019-06-05 ENCOUNTER — Encounter (HOSPITAL_COMMUNITY): Payer: Self-pay

## 2019-06-05 ENCOUNTER — Emergency Department (HOSPITAL_COMMUNITY)
Admission: EM | Admit: 2019-06-05 | Discharge: 2019-06-05 | Disposition: A | Discharge status: Home / Self Care | Payer: Self-pay | Attending: Emergency Medicine | Admitting: Emergency Medicine

## 2019-06-05 ENCOUNTER — Emergency Department (HOSPITAL_COMMUNITY): Payer: Self-pay

## 2019-06-05 DIAGNOSIS — Z20822 Contact with and (suspected) exposure to covid-19: Secondary | ICD-10-CM | POA: Insufficient documentation

## 2019-06-05 DIAGNOSIS — Z87891 Personal history of nicotine dependence: Secondary | ICD-10-CM | POA: Insufficient documentation

## 2019-06-05 DIAGNOSIS — I509 Heart failure, unspecified: Secondary | ICD-10-CM | POA: Insufficient documentation

## 2019-06-05 DIAGNOSIS — J189 Pneumonia, unspecified organism: Secondary | ICD-10-CM | POA: Insufficient documentation

## 2019-06-05 LAB — TROPONIN I (HIGH SENSITIVITY): Troponin I (High Sensitivity): 3 ng/L (ref <18)

## 2019-06-05 LAB — BASIC METABOLIC PANEL
Anion gap: 10 (ref 5–15)
BUN: 14 mg/dL (ref 8–23)
CO2: 29 mmol/L (ref 22–32)
Calcium: 8.8 mg/dL — ABNORMAL LOW (ref 8.9–10.3)
Chloride: 99 mmol/L (ref 98–111)
Creatinine, Ser: 1.19 mg/dL (ref 0.61–1.24)
GFR calc Af Amer: >60 mL/min (ref >60)
GFR calc non Af Amer: >60 mL/min (ref >60)
Glucose, Bld: 112 mg/dL — ABNORMAL HIGH (ref 70–99)
Potassium: 4.1 mmol/L (ref 3.5–5.1)
Sodium: 138 mmol/L (ref 135–145)

## 2019-06-05 LAB — SARS CORONAVIRUS 2 BY RT PCR (HOSPITAL ORDER, PERFORMED IN ~~LOC~~ HOSPITAL LAB): SARS Coronavirus 2: NEGATIVE

## 2019-06-05 LAB — CBC
HCT: 45 % (ref 39.0–52.0)
Hemoglobin: 14.7 g/dL (ref 13.0–17.0)
MCH: 30.5 pg (ref 26.0–34.0)
MCHC: 32.7 g/dL (ref 30.0–36.0)
MCV: 93.4 fL (ref 80.0–100.0)
Platelets: 189 10*3/uL (ref 150–400)
RBC: 4.82 MIL/uL (ref 4.22–5.81)
RDW: 13.7 % (ref 11.5–15.5)
WBC: 10.7 10*3/uL — ABNORMAL HIGH (ref 4.0–10.5)
nRBC: 0 % (ref 0.0–0.2)

## 2019-06-05 MED ORDER — LEVOFLOXACIN 750 MG PO TABS: 750.0000 mg | ORAL_TABLET | Freq: Every day | ORAL | 0 refills | Status: DC

## 2019-06-05 MED ORDER — SODIUM CHLORIDE 0.9% FLUSH
3.0000 mL | Freq: Once | INTRAVENOUS | Status: AC
  Administered 2019-06-05: 3 mL via INTRAVENOUS

## 2019-06-05 NOTE — ED Provider Notes (Addendum)
[facility] Provider Note   CSN: [id number] Arrival date & time: 06/05/19  1004     History Chief Complaint  Patient presents with  . Chest Pain    [facility] is a 64 y.o. male with a past medical history of emphysema/COPD, CHF who presents emergency department with chief complaint of cough and chest pain.  Patient states that 3 days ago he was at home and began having shaking chills, turned to the heat up.  His wife commented that she thought he might be sick and he began taking some doxycycline which was previously prescribed for pneumonia by Dr. Gerarda Fraction.  The patient states that he since that time he has developed a painful cough.  He points to his right middle and lower rib cage and states that it feels like "when you get in a fight somebody breaks ribs."  He has not measured his temperature at home but denies fever at this time.  He has a portable nebulizer which he has been using for any wheezing.  He does not have any current shortness of breath or wheezing.  He denies unilateral leg swelling, pleuritic chest pain.  He states that the pain is really only there when he coughs.  He states that 2 nights ago he had trouble sleeping on that side due to pain.  He denies nausea, diaphoresis, exertional chest pain or dyspnea, abdominal pain, nausea, vomiting or urinary symptoms. HPI     Past Medical History:  Diagnosis Date  . Arthritis   . CHF (congestive heart failure) ([place])   . Emphysema     Patient Active Problem List   Diagnosis Date Noted  . Pain in joint, shoulder region 10/22/2010  . Muscle weakness (generalized) 10/22/2010  . Rotator cuff syndrome of shoulder and allied disorders 10/22/2010  . Capsulitis of shoulder 10/03/2010  . Bursitis of shoulder, right 10/03/2010  . Epigastric pain 05/06/2010  . Helicobacter pylori (H. pylori) infection 05/06/2010  . Rectal bleed 05/06/2010  . Constipation 05/06/2010  . Rectal prolapse 05/06/2010  .  Abnormal abdominal ultrasound 05/06/2010    Past Surgical History:  Procedure Laterality Date  . COLONOSCOPY  2002   internal hemorrhoids  . TONSILLECTOMY         Family History  Problem Relation Age of Onset  . Lung cancer Father        deceased  . Heart disease Mother        heart valve replacement  . Colon cancer Neg Hx   . Liver disease Neg Hx     Social History   Tobacco Use  . Smoking status: Former Smoker    Packs/day: 1.00    Years: 29.00    Pack years: 29.00    Types: Cigarettes    Quit date: 09/20/2017    Years since quitting: 1.7  . Smokeless tobacco: Never Used  Substance Use Topics  . Alcohol use: No  . Drug use: No    Home Medications Prior to Admission medications   Medication Sig Start Date End Date Taking? Authorizing Provider  albuterol (PROVENTIL) (5 MG/ML) 0.5% nebulizer solution Take 0.5 mLs (2.5 mg total) by nebulization every 6 (six) hours as needed for wheezing or shortness of breath. 05/05/18  Yes Rancour, Annie Main, MD  Budeson-Glycopyrrol-Formoterol (BREZTRI AEROSPHERE) 160-9-4.8 MCG/ACT AERO Inhale 1 Dose into the lungs daily.   Yes [provider]  doxycycline (VIBRAMYCIN) 100 MG capsule Take 1 capsule (100 mg total) by mouth 2 (two) times daily.  05/05/18  Yes Rancour, Annie Main, MD  albuterol (PROVENTIL HFA;VENTOLIN HFA) 108 (90 Base) MCG/ACT inhaler Inhale 2 puffs into the lungs every 4 (four) hours as needed for wheezing or shortness of breath. 03/06/18   Noemi Chapel, MD  levofloxacin (LEVAQUIN) 750 MG tablet Take 1 tablet (750 mg total) by mouth daily. 06/05/19   Margarita Mail, PA-C    Allergies    Penicillins and Yellow jacket venom [bee venom]  Review of Systems   Review of Systems Ten systems reviewed and are negative for acute change, except as noted in the HPI.   Physical Exam Updated Vital Signs BP 134/87   Pulse 80   Temp 98.7 F (37.1 C) (Oral)   Resp 16   Ht 5\' 9"  (1.753 m)   Wt 53.1 kg   SpO2 95%   BMI  17.28 kg/m   Physical Exam Vitals and nursing note reviewed.  Constitutional:      General: He is not in acute distress.    Appearance: He is well-developed. He is not diaphoretic.  HENT:     Head: Normocephalic and atraumatic.  Eyes:     General: No scleral icterus.    Conjunctiva/sclera: Conjunctivae normal.  Cardiovascular:     Rate and Rhythm: Normal rate and regular rhythm.     Heart sounds: Normal heart sounds.  Pulmonary:     Effort: Pulmonary effort is normal. No respiratory distress.     Breath sounds: Normal breath sounds. No wheezing, rhonchi or rales.  Chest:     Chest wall: Tenderness present.    Abdominal:     Palpations: Abdomen is soft.     Tenderness: There is no abdominal tenderness.  Musculoskeletal:     Cervical back: Normal range of motion and neck supple.  Skin:    General: Skin is warm and dry.  Neurological:     Mental Status: He is alert.  Psychiatric:        Behavior: Behavior normal.     ED Results / Procedures / Treatments   Labs (all labs ordered are listed, but only abnormal results are displayed) Labs Reviewed  BASIC METABOLIC PANEL - Abnormal; Notable for the following components:      Result Value   Glucose, Bld 112 (*)    Calcium 8.8 (*)    All other components within normal limits  CBC - Abnormal; Notable for the following components:   WBC 10.7 (*)    All other components within normal limits  SARS CORONAVIRUS 2 BY RT PCR ([place] ORDER, [facility])  TROPONIN I (HIGH SENSITIVITY)    EKG EKG Interpretation  Date/Time:  Sunday Jun 05 2019 10:16:50 EDT Ventricular Rate:  108 PR Interval:    QRS Duration: 99 QT Interval:  328 QTC Calculation: 440 R Axis:   55 Text Interpretation: Sinus tachycardia Right atrial enlargement Anteroseptal infarct, age indeterminate No significant change since last tracing Confirmed by Fredia Sorrow [phone]) on 06/05/2019 10:24:25 AM   Radiology DG Chest 2  View  Result Date: 06/05/2019 CLINICAL DATA:  Chest pain EXAM: CHEST - 2 VIEW COMPARISON:  03/06/18 FINDINGS: Heart size normal. No pleural effusion or edema. Lungs are hyperinflated. Airspace opacity within the right middle lobe identified compatible with pneumonia. No pleural effusions or edema. Visualized osseous structures are unremarkable. IMPRESSION: 1. Right middle lobe pneumonia Electronically Signed   By: Kerby Moors M.D.   On: 06/05/2019 11:21    Procedures Procedures (including critical care time)  Medications  Ordered in ED Medications  sodium chloride flush (NS) 0.9 % injection 3 mL (3 mLs Intravenous Given 06/05/19 1035)    ED Course  I have reviewed the triage vital signs and the nursing notes.  Pertinent labs & imaging results that were available during my care of the patient were reviewed by me and considered in my medical decision making (see chart for details).  Clinical Course as of Jun 04 1252  Sun Jun 05, 2019  1115 WBC(!): 10.7 [AH]    Clinical Course User Index [AH] Margarita Mail, PA-C   MDM Rules/Calculators/A&P                      CC: Chest pain VS:  Vitals:   06/05/19 1017 06/05/19 1018 06/05/19 1019 06/05/19 1200  BP:   131/80 134/87  Pulse:   (!) 108 80  Resp:   20 16  Temp:   98.7 F (37.1 C)   TempSrc:   Oral   SpO2: 95%  97% 95%  Weight:  53.1 kg    Height:  5\' 9"  (1.753 m)      [id number] is gathered by patient and EMR. Previous records obtained and reviewed. DDX:The patient's complaint of chest pain involves an extensive number of diagnostic and treatment options, and is a complaint that carries with it a high risk of complications, morbidity, and potential mortality. Given the large differential diagnosis, medical decision making is of high complexity. The emergent differential diagnosis of chest pain includes: Acute coronary syndrome, pericarditis, aortic dissection, pulmonary embolism, tension pneumothorax, pneumonia, and esophageal  rupture.  Labs: I ordered reviewed and interpreted labs which include Covid test which is negative, CBC with mild leukocytosis, normal hemoglobin, troponin within normal limits, BMP without significant abnormality. Imaging: I ordered and reviewed images which included 2 view chest x-ray. I independently visualized and interpreted all imaging. Significant findings include right middle lobe pneumonia. EKG: Sinus tachycardia at a rate of 108 Consults: N/A MDM: Patient with complaint of right chest pain, worse with cough, I have low suspicion for ACS.  Have low suspicion for pulmonary embolus.  Patient has normal oxygen saturations.  He has no exertional chest pain, hemoptysis, unilateral leg swelling.  This feels the same as previous episodes of pneumonia.  He has been taking doxycycline for the past 2 days and has had no fever since.  Patient has a penicillin allergy, review of previous medications given shows no previous history of use of cephalosporins.  Therefore patient will be treated with Levaquin.  Case discussed with Dr. Venita Sheffield is in agreement with work-up and plan for discharge.  He is hemodynamically stable and without any evidence of respiratory failure.  No evidence of sepsis.  Appears appropriate for discharge at this time. Patient disposition: The patient appears reasonably screened and/or stabilized for discharge and I doubt any other medical condition or other [facility] requiring further screening, evaluation, or treatment in the ED at this time prior to discharge. I have discussed lab and/or imaging findings with the patient and answered all questions/concerns to the best of my ability.I have discussed return precautions and OP follow up.  JASMEET MARON was evaluated in Emergency Department on 06/05/2019 for the symptoms described in the history of present illness. He was evaluated in the context of the global COVID-19 pandemic, which necessitated consideration that the patient might be at  risk for infection with the SARS-CoV-2 virus that causes COVID-19. Institutional protocols and algorithms that pertain to the evaluation  of patients at risk for COVID-19 are in a state of rapid change based on information released by regulatory bodies including the CDC and federal and state organizations. These policies and algorithms were followed during the patient's care in the ED.   Final Clinical Impression(s) / ED Diagnoses Final diagnoses:  Community acquired pneumonia of right middle lobe of lung    Rx / DC Orders ED Discharge Orders         Ordered    levofloxacin (LEVAQUIN) 750 MG tablet  Daily     06/05/19 1200           Margarita Mail, PA-C 06/05/19 1254    Margarita Mail, PA-C 06/05/19 1256    Fredia Sorrow, MD 06/09/19 [phone]

## 2019-06-05 NOTE — Discharge Instructions (Signed)
Contact a health care provider if: °You have a fever. °You are losing sleep because you cannot control your cough with cough medicine. °Get help right away if: °You have worsening shortness of breath. °You have increased chest pain. °Your sickness becomes worse, especially if you are an older adult or have a weakened immune system. °You cough up blood. °

## 2019-06-05 NOTE — ED Triage Notes (Signed)
Pt reports he noted chills on Friday. Chest pain and non productive cough started yesterday. Pt reports some SOB. Pt has been taking left over doxycycline

## 2020-05-07 DIAGNOSIS — J449 Chronic obstructive pulmonary disease, unspecified: Secondary | ICD-10-CM | POA: Diagnosis not present

## 2020-05-07 DIAGNOSIS — D691 Qualitative platelet defects: Secondary | ICD-10-CM | POA: Diagnosis not present

## 2020-05-07 DIAGNOSIS — Z681 Body mass index (BMI) 19 or less, adult: Secondary | ICD-10-CM | POA: Diagnosis not present

## 2020-09-21 DIAGNOSIS — J449 Chronic obstructive pulmonary disease, unspecified: Secondary | ICD-10-CM | POA: Diagnosis not present

## 2020-09-21 DIAGNOSIS — Z681 Body mass index (BMI) 19 or less, adult: Secondary | ICD-10-CM | POA: Diagnosis not present

## 2020-09-21 DIAGNOSIS — T50905A Adverse effect of unspecified drugs, medicaments and biological substances, initial encounter: Secondary | ICD-10-CM | POA: Diagnosis not present

## 2020-09-21 DIAGNOSIS — L509 Urticaria, unspecified: Secondary | ICD-10-CM | POA: Diagnosis not present

## 2020-09-21 DIAGNOSIS — I5032 Chronic diastolic (congestive) heart failure: Secondary | ICD-10-CM | POA: Diagnosis not present

## 2020-09-26 ENCOUNTER — Other Ambulatory Visit: Payer: Self-pay

## 2020-09-26 ENCOUNTER — Emergency Department (HOSPITAL_COMMUNITY)
Admission: EM | Admit: 2020-09-26 | Discharge: 2020-09-26 | Disposition: A | Discharge status: Home / Self Care | Payer: Medicare Other | Attending: Emergency Medicine | Admitting: Emergency Medicine

## 2020-09-26 ENCOUNTER — Encounter (HOSPITAL_COMMUNITY): Payer: Self-pay

## 2020-09-26 DIAGNOSIS — R21 Rash and other nonspecific skin eruption: Secondary | ICD-10-CM | POA: Diagnosis present

## 2020-09-26 DIAGNOSIS — R0602 Shortness of breath: Secondary | ICD-10-CM | POA: Diagnosis not present

## 2020-09-26 DIAGNOSIS — L509 Urticaria, unspecified: Secondary | ICD-10-CM | POA: Insufficient documentation

## 2020-09-26 DIAGNOSIS — I509 Heart failure, unspecified: Secondary | ICD-10-CM | POA: Insufficient documentation

## 2020-09-26 DIAGNOSIS — Z87891 Personal history of nicotine dependence: Secondary | ICD-10-CM | POA: Diagnosis not present

## 2020-09-26 DIAGNOSIS — Z79899 Other long term (current) drug therapy: Secondary | ICD-10-CM | POA: Diagnosis not present

## 2020-09-26 MED ORDER — PREDNISONE 10 MG PO TABS: 40.0000 mg | ORAL_TABLET | Freq: Every day | ORAL | 0 refills | Status: AC

## 2020-09-26 NOTE — ED Provider Notes (Signed)
[facility] Provider Note   CSN: [id number] Arrival date & time: 09/26/20  [id number]     History Chief Complaint  Patient presents with   Allergic Reaction   Rash    [facility] is a 65 y.o. male.  HPI Patient is a 65 year old male who presents to the emergency department due to a rash to the bilateral upper extremities.  States his symptoms started about 5 days ago.  States he followed up with his PCP and was started on a methylprednisolone taper as well as montelukast.  States he has been compliant with his medications and his symptoms began to improve for about 2 to 3 days.  He states that he took his last dose of methylprednisolone this morning and then later today his symptoms began worsening once again.  Reports a pruritic rash to the bilateral upper extremities.  Reports some mild shortness of breath but patient has a history of emphysema and states that he has not been using his albuterol inhaler recently because he was concerned that this could be the cause of his allergic reaction.  Denies any chest pain, oral swelling, difficulty swallowing, facial swelling.    Past Medical History:  Diagnosis Date   Arthritis    CHF (congestive heart failure) ([place])    Emphysema     Patient Active Problem List   Diagnosis Date Noted   Pain in joint, shoulder region 10/22/2010   Muscle weakness (generalized) 10/22/2010   Rotator cuff syndrome of shoulder and allied disorders 10/22/2010   Capsulitis of shoulder 10/03/2010   Bursitis of shoulder, right 10/03/2010   Epigastric pain 0000000   Helicobacter pylori (H. pylori) infection 05/06/2010   Rectal bleed 05/06/2010   Constipation 05/06/2010   Rectal prolapse 05/06/2010   Abnormal abdominal ultrasound 05/06/2010    Past Surgical History:  Procedure Laterality Date   COLONOSCOPY  2002   internal hemorrhoids   TONSILLECTOMY         Family History  Problem Relation Age of Onset   Lung cancer Father         deceased   Heart disease Mother        heart valve replacement   Colon cancer Neg Hx    Liver disease Neg Hx     Social History   Tobacco Use   Smoking status: Former    Packs/day: 1.00    Years: 29.00    Pack years: 29.00    Types: Cigarettes    Quit date: 09/20/2017    Years since quitting: 3.0   Smokeless tobacco: Never  Substance Use Topics   Alcohol use: No   Drug use: No    Home Medications Prior to Admission medications   Medication Sig Start Date End Date Taking? Authorizing Provider  predniSONE (DELTASONE) 10 MG tablet Take 4 tablets (40 mg total) by mouth daily with breakfast for 5 days. 09/26/20 10/01/20 Yes Rayna Sexton, PA-C  albuterol (PROVENTIL HFA;VENTOLIN HFA) 108 (90 Base) MCG/ACT inhaler Inhale 2 puffs into the lungs every 4 (four) hours as needed for wheezing or shortness of breath. 03/06/18   Noemi Chapel, MD  albuterol (PROVENTIL) (5 MG/ML) 0.5% nebulizer solution Take 0.5 mLs (2.5 mg total) by nebulization every 6 (six) hours as needed for wheezing or shortness of breath. 05/05/18   Rancour, Annie Main, MD  Budeson-Glycopyrrol-Formoterol (BREZTRI AEROSPHERE) 160-9-4.8 MCG/ACT AERO Inhale 1 Dose into the lungs daily.    [provider]  doxycycline (VIBRAMYCIN) 100 MG capsule Take 1 capsule (  100 mg total) by mouth 2 (two) times daily. 05/05/18   Rancour, Annie Main, MD  levofloxacin (LEVAQUIN) 750 MG tablet Take 1 tablet (750 mg total) by mouth daily. 06/05/19   Margarita Mail, PA-C    Allergies    Penicillins and Yellow jacket venom [bee venom]  Review of Systems   Review of Systems  HENT:  Negative for drooling and facial swelling.   Respiratory:  Positive for shortness of breath.   Cardiovascular:  Negative for chest pain.  Skin:  Positive for color change and rash.   Physical Exam Updated Vital Signs BP (!) 158/99 (BP Location: Right Arm)   Pulse 93   Temp 98 F (36.7 C) (Temporal)   Resp 18   Ht '5\' 8"'$  (1.727 m)   Wt 56.6 kg   SpO2  100%   BMI 18.96 kg/m   Physical Exam Vitals and nursing note reviewed.  Constitutional:      General: He is not in acute distress.    Appearance: Normal appearance. He is not ill-appearing, toxic-appearing or diaphoretic.  HENT:     Head: Normocephalic and atraumatic.     Right Ear: External ear normal.     Left Ear: External ear normal.     Nose: Nose normal.     Mouth/Throat:     Mouth: Mucous membranes are moist.     Pharynx: Oropharynx is clear. No oropharyngeal exudate or posterior oropharyngeal erythema.  Eyes:     Extraocular Movements: Extraocular movements intact.  Cardiovascular:     Rate and Rhythm: Normal rate and regular rhythm.     Pulses: Normal pulses.     Heart sounds: Normal heart sounds. No murmur heard.   No friction rub. No gallop.  Pulmonary:     Effort: Pulmonary effort is normal. No respiratory distress.     Breath sounds: Normal breath sounds. No stridor. No wheezing, rhonchi or rales.  Abdominal:     General: Abdomen is flat. There is no distension.  Musculoskeletal:        General: Normal range of motion.     Cervical back: Normal range of motion and neck supple. No tenderness.  Skin:    General: Skin is warm and dry.     Findings: Erythema present.     Comments: Urticaria noted diffusely to the bilateral upper extremities.  Neurological:     General: No focal deficit present.     Mental Status: He is alert and oriented to person, place, and time.  Psychiatric:        Mood and Affect: Mood normal.        Behavior: Behavior normal.    ED Results / Procedures / Treatments   Labs (all labs ordered are listed, but only abnormal results are displayed) Labs Reviewed - No data to display  EKG None  Radiology No results found.  Procedures Procedures   Medications Ordered in ED Medications - No data to display  ED Course  I have reviewed the triage vital signs and the nursing notes.  Pertinent labs & imaging results that were  available during my care of the patient were reviewed by me and considered in my medical decision making (see chart for details).    MDM Rules/Calculators/A&P                          Patient is a 65 year old male who presents to the emergency department with what appears to be an allergic reaction.  Patient has been experiencing symptoms for about 5 days and was started on methylprednisolone as well as montelukast.  Initially this improved his symptoms but after completing his taper this morning he began experiencing worsening urticaria along the upper arms.  Unsure of the source of his symptoms.  He does note that he started a new soap about 3 months ago but had been using this persistently for the past 3 months without issue.  Otherwise denies any new detergents, soaps, clothing.  Denies any facial swelling, oral swelling, chest pain, difficulty swallowing.    We will start patient on additional prednisone.  Denies a history of diabetes mellitus.  Recommended that he follow-up with his PCP regarding his symptoms.  Discussed symptoms associated with anaphylaxis and patient verbalized understanding and understands return to the emergency department if the symptoms should develop.  Feel he is stable for discharge at this time and he is agreeable.  His questions were answered and he was amicable at the time of discharge.  Final Clinical Impression(s) / ED Diagnoses Final diagnoses:  Urticaria    Rx / DC Orders ED Discharge Orders          Ordered    predniSONE (DELTASONE) 10 MG tablet  Daily with breakfast        09/26/20 1356             Rayna Sexton, PA-C 09/26/20 1410    Milton Ferguson, MD 09/29/20 1327

## 2020-09-26 NOTE — Discharge Instructions (Addendum)
I am prescribing you a strong steroid medication called prednisone.  Please only take this as prescribed.  Please take it early in the morning, as this medication can be stimulating and make it difficult to sleep at night.  Please follow-up with your regular doctor regarding your symptoms today.  If you develop any new or worsening symptoms such as facial swelling, tongue swelling, difficulty swallowing, shortness of breath, please come back to the emergency department immediately for reevaluation.  It was a pleasure to meet you.

## 2020-09-26 NOTE — ED Triage Notes (Addendum)
Pt presents with rash to bilateral arms. Pt states he seen Dr Gerarda Fraction for the same and was given methylprednisolone and montelukast and he was on the last pill of methylprednisolone and rash became worse. Rash is burning and itching. Pt denies oral swelling.

## 2020-12-17 ENCOUNTER — Other Ambulatory Visit (HOSPITAL_COMMUNITY): Payer: Self-pay | Admitting: Internal Medicine

## 2020-12-17 DIAGNOSIS — R059 Cough, unspecified: Secondary | ICD-10-CM

## 2020-12-17 DIAGNOSIS — Z681 Body mass index (BMI) 19 or less, adult: Secondary | ICD-10-CM | POA: Diagnosis not present

## 2020-12-17 DIAGNOSIS — J441 Chronic obstructive pulmonary disease with (acute) exacerbation: Secondary | ICD-10-CM | POA: Diagnosis not present

## 2020-12-21 ENCOUNTER — Ambulatory Visit (HOSPITAL_COMMUNITY)
Admission: RE | Admit: 2020-12-21 | Discharge: 2020-12-21 | Disposition: A | Discharge status: Home / Self Care | Payer: Medicare Other | Source: Ambulatory Visit | Attending: Internal Medicine | Admitting: Internal Medicine

## 2020-12-21 ENCOUNTER — Other Ambulatory Visit: Payer: Self-pay

## 2020-12-21 DIAGNOSIS — R059 Cough, unspecified: Secondary | ICD-10-CM | POA: Insufficient documentation

## 2020-12-21 DIAGNOSIS — J439 Emphysema, unspecified: Secondary | ICD-10-CM | POA: Diagnosis not present

## 2021-03-13 DIAGNOSIS — K862 Cyst of pancreas: Secondary | ICD-10-CM | POA: Diagnosis not present

## 2021-03-13 DIAGNOSIS — R059 Cough, unspecified: Secondary | ICD-10-CM | POA: Diagnosis not present

## 2021-03-13 DIAGNOSIS — Z681 Body mass index (BMI) 19 or less, adult: Secondary | ICD-10-CM | POA: Diagnosis not present

## 2021-03-13 DIAGNOSIS — J441 Chronic obstructive pulmonary disease with (acute) exacerbation: Secondary | ICD-10-CM | POA: Diagnosis not present

## 2021-03-13 DIAGNOSIS — K219 Gastro-esophageal reflux disease without esophagitis: Secondary | ICD-10-CM | POA: Diagnosis not present

## 2021-04-22 DIAGNOSIS — K862 Cyst of pancreas: Secondary | ICD-10-CM | POA: Diagnosis not present

## 2021-04-22 DIAGNOSIS — K219 Gastro-esophageal reflux disease without esophagitis: Secondary | ICD-10-CM | POA: Diagnosis not present

## 2021-05-07 DIAGNOSIS — Z681 Body mass index (BMI) 19 or less, adult: Secondary | ICD-10-CM | POA: Diagnosis not present

## 2021-05-07 DIAGNOSIS — K862 Cyst of pancreas: Secondary | ICD-10-CM | POA: Diagnosis not present

## 2021-05-07 DIAGNOSIS — J449 Chronic obstructive pulmonary disease, unspecified: Secondary | ICD-10-CM | POA: Diagnosis not present

## 2021-06-27 DIAGNOSIS — K219 Gastro-esophageal reflux disease without esophagitis: Secondary | ICD-10-CM | POA: Diagnosis not present

## 2021-06-27 DIAGNOSIS — J441 Chronic obstructive pulmonary disease with (acute) exacerbation: Secondary | ICD-10-CM | POA: Diagnosis not present

## 2021-06-27 DIAGNOSIS — Z9229 Personal history of other drug therapy: Secondary | ICD-10-CM | POA: Diagnosis not present

## 2021-06-27 DIAGNOSIS — Z681 Body mass index (BMI) 19 or less, adult: Secondary | ICD-10-CM | POA: Diagnosis not present

## 2021-06-27 DIAGNOSIS — K862 Cyst of pancreas: Secondary | ICD-10-CM | POA: Diagnosis not present

## 2021-06-27 DIAGNOSIS — Z0001 Encounter for general adult medical examination with abnormal findings: Secondary | ICD-10-CM | POA: Diagnosis not present

## 2021-06-28 DIAGNOSIS — E782 Mixed hyperlipidemia: Secondary | ICD-10-CM | POA: Diagnosis not present

## 2021-06-28 DIAGNOSIS — Z9229 Personal history of other drug therapy: Secondary | ICD-10-CM | POA: Diagnosis not present

## 2021-06-28 DIAGNOSIS — J449 Chronic obstructive pulmonary disease, unspecified: Secondary | ICD-10-CM | POA: Diagnosis not present

## 2021-06-28 DIAGNOSIS — Z0001 Encounter for general adult medical examination with abnormal findings: Secondary | ICD-10-CM | POA: Diagnosis not present

## 2021-06-28 DIAGNOSIS — E039 Hypothyroidism, unspecified: Secondary | ICD-10-CM | POA: Diagnosis not present

## 2021-06-28 DIAGNOSIS — E559 Vitamin D deficiency, unspecified: Secondary | ICD-10-CM | POA: Diagnosis not present

## 2021-08-07 ENCOUNTER — Emergency Department (HOSPITAL_COMMUNITY)
Admission: EM | Admit: 2021-08-07 | Discharge: 2021-08-07 | Disposition: A | Discharge status: Home / Self Care | Payer: Medicare Other | Attending: Emergency Medicine | Admitting: Emergency Medicine

## 2021-08-07 ENCOUNTER — Encounter (HOSPITAL_COMMUNITY): Payer: Self-pay

## 2021-08-07 ENCOUNTER — Emergency Department (HOSPITAL_COMMUNITY): Payer: Medicare Other

## 2021-08-07 ENCOUNTER — Other Ambulatory Visit: Payer: Self-pay

## 2021-08-07 DIAGNOSIS — Z7951 Long term (current) use of inhaled steroids: Secondary | ICD-10-CM | POA: Insufficient documentation

## 2021-08-07 DIAGNOSIS — R944 Abnormal results of kidney function studies: Secondary | ICD-10-CM | POA: Diagnosis not present

## 2021-08-07 DIAGNOSIS — R0602 Shortness of breath: Secondary | ICD-10-CM | POA: Diagnosis not present

## 2021-08-07 DIAGNOSIS — J441 Chronic obstructive pulmonary disease with (acute) exacerbation: Secondary | ICD-10-CM | POA: Diagnosis not present

## 2021-08-07 DIAGNOSIS — I509 Heart failure, unspecified: Secondary | ICD-10-CM | POA: Insufficient documentation

## 2021-08-07 LAB — BASIC METABOLIC PANEL
Anion gap: 5 (ref 5–15)
BUN: 14 mg/dL (ref 8–23)
CO2: 27 mmol/L (ref 22–32)
Calcium: 8.8 mg/dL — ABNORMAL LOW (ref 8.9–10.3)
Chloride: 107 mmol/L (ref 98–111)
Creatinine, Ser: 1.4 mg/dL — ABNORMAL HIGH (ref 0.61–1.24)
GFR, Estimated: 55 mL/min — ABNORMAL LOW (ref >60)
Glucose, Bld: 113 mg/dL — ABNORMAL HIGH (ref 70–99)
Potassium: 4.5 mmol/L (ref 3.5–5.1)
Sodium: 139 mmol/L (ref 135–145)

## 2021-08-07 LAB — CBC
HCT: 43.3 % (ref 39.0–52.0)
Hemoglobin: 14.2 g/dL (ref 13.0–17.0)
MCH: 29.9 pg (ref 26.0–34.0)
MCHC: 32.8 g/dL (ref 30.0–36.0)
MCV: 91.2 fL (ref 80.0–100.0)
Platelets: 171 10*3/uL (ref 150–400)
RBC: 4.75 MIL/uL (ref 4.22–5.81)
RDW: 13.6 % (ref 11.5–15.5)
WBC: 4.7 10*3/uL (ref 4.0–10.5)
nRBC: 0 % (ref 0.0–0.2)

## 2021-08-07 LAB — BRAIN NATRIURETIC PEPTIDE: B Natriuretic Peptide: 15 pg/mL (ref 0.0–100.0)

## 2021-08-07 MED ORDER — ALBUTEROL SULFATE HFA 108 (90 BASE) MCG/ACT IN AERS: 2.0000 | INHALATION_SPRAY | RESPIRATORY_TRACT | Status: DC | PRN

## 2021-08-07 MED ORDER — PREDNISONE 10 MG PO TABS: ORAL_TABLET | ORAL | 0 refills | Status: AC

## 2021-08-07 MED ORDER — IPRATROPIUM-ALBUTEROL 0.5-2.5 (3) MG/3ML IN SOLN
3.0000 mL | Freq: Once | RESPIRATORY_TRACT | Status: AC
Start: 2021-08-07 — End: 2021-08-07
  Administered 2021-08-07: 3 mL via RESPIRATORY_TRACT
  Filled 2021-08-07: qty 3

## 2021-08-07 MED ORDER — PREDNISONE 10 MG PO TABS
60.0000 mg | ORAL_TABLET | Freq: Once | ORAL | Status: AC
Start: 2021-08-07 — End: 2021-08-07
  Administered 2021-08-07: 60 mg via ORAL
  Filled 2021-08-07: qty 1

## 2021-08-07 NOTE — ED Triage Notes (Signed)
Pt has hx of COPD and has been feeling short of breath for 1 week. Quit smoking 3 years ago. Took albuterol at home pta with temporary relief.

## 2021-08-07 NOTE — ED Provider Notes (Signed)
[facility] Provider Note   CSN: [id number] Arrival date & time: 08/07/21  0750     History  Chief Complaint  Patient presents with   Shortness of Breath    Robert Duarte is a 66 y.o. male.   Shortness of Breath  Patient has a history of COPD and CHF.  He presents with complaints of shortness of breath and cough.  Patient states the symptoms have been going on for few days.  He has been coughing a lot and is feeling short of breath especially after the coughing spells.  He also notices that he is coughing a lot when he is trying to lie flat at night.  He has not noticed any leg swelling.  He does have some discomfort in his chest with coughing but otherwise no chest pain.  No fevers.  This morning the symptoms were worse so he came to the ED.  He does notice some mild improvement when he uses inhaler    Home Medications Prior to Admission medications   Medication Sig Start Date End Date Taking? Authorizing Provider  predniSONE (DELTASONE) 10 MG tablet Take 5 tablets (50 mg total) by mouth daily with breakfast for 2 days, THEN 4 tablets (40 mg total) daily with breakfast for 2 days, THEN 3 tablets (30 mg total) daily with breakfast for 2 days, THEN 2 tablets (20 mg total) daily with breakfast for 2 days, THEN 1 tablet (10 mg total) daily with breakfast for 2 days. 08/07/21 08/16/21 Yes Dorie Rank, MD  albuterol (PROVENTIL HFA;VENTOLIN HFA) 108 (90 Base) MCG/ACT inhaler Inhale 2 puffs into the lungs every 4 (four) hours as needed for wheezing or shortness of breath. 03/06/18   Noemi Chapel, MD  albuterol (PROVENTIL) (5 MG/ML) 0.5% nebulizer solution Take 0.5 mLs (2.5 mg total) by nebulization every 6 (six) hours as needed for wheezing or shortness of breath. 05/05/18   Rancour, Annie Main, MD  Budeson-Glycopyrrol-Formoterol (BREZTRI AEROSPHERE) 160-9-4.8 MCG/ACT AERO Inhale 1 Dose into the lungs daily.    [provider]  doxycycline (VIBRAMYCIN) 100 MG capsule  Take 1 capsule (100 mg total) by mouth 2 (two) times daily. 05/05/18   Rancour, Annie Main, MD  levofloxacin (LEVAQUIN) 750 MG tablet Take 1 tablet (750 mg total) by mouth daily. 06/05/19   Margarita Mail, PA-C      Allergies    Penicillins and Yellow jacket venom [bee venom]    Review of Systems   Review of Systems  Respiratory:  Positive for shortness of breath.     Physical Exam Updated Vital Signs BP 133/86   Pulse 82   Temp 98.9 F (37.2 C) (Oral)   Resp 11   Ht 1.727 m ('5\' 8"'$ )   Wt 58.5 kg   SpO2 94%   BMI 19.61 kg/m  Physical Exam Vitals and nursing note reviewed.  Constitutional:      Appearance: He is well-developed. He is not ill-appearing or diaphoretic.  HENT:     Head: Normocephalic and atraumatic.     Right Ear: External ear normal.     Left Ear: External ear normal.  Eyes:     General: No scleral icterus.       Right eye: No discharge.        Left eye: No discharge.     Conjunctiva/sclera: Conjunctivae normal.  Neck:     Trachea: No tracheal deviation.  Cardiovascular:     Rate and Rhythm: Normal rate and regular rhythm.  Pulmonary:  Effort: Pulmonary effort is normal. No respiratory distress.     Breath sounds: No stridor. Decreased breath sounds present. No wheezing or rales.     Comments: Diminished breath sounds throughout Abdominal:     General: Bowel sounds are normal. There is no distension.     Palpations: Abdomen is soft.     Tenderness: There is no abdominal tenderness. There is no guarding or rebound.  Musculoskeletal:        General: No tenderness or deformity.     Cervical back: Neck supple.  Skin:    General: Skin is warm and dry.     Findings: No rash.  Neurological:     General: No focal deficit present.     Mental Status: He is alert.     Cranial Nerves: No cranial nerve deficit (no facial droop, extraocular movements intact, no slurred speech).     Sensory: No sensory deficit.     Motor: No abnormal muscle tone or seizure  activity.     Coordination: Coordination normal.  Psychiatric:        Mood and Affect: Mood normal.     ED Results / Procedures / Treatments   Labs (all labs ordered are listed, but only abnormal results are displayed) Labs Reviewed  BASIC METABOLIC PANEL - Abnormal; Notable for the following components:      Result Value   Glucose, Bld 113 (*)    Creatinine, Ser 1.40 (*)    Calcium 8.8 (*)    GFR, Estimated 55 (*)    All other components within normal limits  CBC  BRAIN NATRIURETIC PEPTIDE    EKG EKG Interpretation  Date/Time:  Wednesday August 07 2021 08:03:55 EDT Ventricular Rate:  94 PR Interval:  165 QRS Duration: 103 QT Interval:  386 QTC Calculation: 483 R Axis:   85 Text Interpretation: Sinus rhythm Right atrial enlargement Borderline right axis deviation Borderline prolonged QT interval No significant change since last tracing Confirmed by Dorie Rank [phone]) on 08/07/2021 8:11:44 AM  Radiology DG Chest Port 1 View  Result Date: 08/07/2021 CLINICAL DATA:  Shortness of breath. EXAM: PORTABLE CHEST 1 VIEW COMPARISON:  December 21, 2020. FINDINGS: The heart size and mediastinal contours are within normal limits. Both lungs are clear. The visualized skeletal structures are unremarkable. IMPRESSION: No active disease. Electronically Signed   By: Marijo Conception M.D.   On: 08/07/2021 08:43    Procedures Procedures    Medications Ordered in ED Medications  ipratropium-albuterol (DUONEB) 0.5-2.5 (3) MG/3ML nebulizer solution 3 mL (3 mLs Nebulization Given 08/07/21 0828)  predniSONE (DELTASONE) tablet 60 mg (60 mg Oral Given 08/07/21 0848)    ED Course/ Medical Decision Making/ A&P Clinical Course as of 08/07/21 0948  Wed Aug 07, 2021  0930 CBC Normal [JK]  0930 DG Chest Port 1 View Chest x-ray without acute findings [JK]  0930 Brain natriuretic peptide Normal [JK]  6073 Basic metabolic panel(!) Creatinine increased compared to previous [JK]  0946 Pt feeling much  better after breathing treatment [JK]    Clinical Course User Index [JK] Dorie Rank, MD                           Medical Decision Making Differential diagnosis includes but not limited to, COPD, CHF, pneumothorax, pneumonia  Problems Addressed: COPD exacerbation ([place]): acute illness or injury that poses a threat to life or bodily functions  Amount and/or Complexity of Data Reviewed Labs: ordered. Decision-making  details documented in ED Course. Radiology: ordered and independent interpretation performed. Decision-making details documented in ED Course.  Risk Prescription drug management.  Patient present with complaints of acute shortness of breath.  X-ray does not show any insulin pneumonia or pneumothorax.  Findings not suggestive of CHF.  Patient was given a breathing treatment and a dose of steroids.  He has noticed significant improvement.  Presentation consistent with a COPD exacerbation.  Patient does not have an oxygen requirement.  He is not tachypneic.  No indications for hospitalization.  Will discharge home with a course of steroids.  Outpatient follow-up.  Warning signs precautions discussed.         Final Clinical Impression(s) / ED Diagnoses Final diagnoses:  COPD exacerbation ([place])    Rx / DC Orders ED Discharge Orders          Ordered    predniSONE (DELTASONE) 10 MG tablet  Q breakfast        08/07/21 0947              Dorie Rank, MD 08/07/21 [phone]

## 2021-08-07 NOTE — Discharge Instructions (Signed)
Continue your breathing treatments.  Take the steroids as prescribed to help with your COPD.  Follow-up with your doctor next week to be rechecked if the symptoms have not resolved.  Return as needed for worsening symptoms

## 2021-08-12 DIAGNOSIS — K219 Gastro-esophageal reflux disease without esophagitis: Secondary | ICD-10-CM | POA: Diagnosis not present

## 2021-08-12 DIAGNOSIS — J441 Chronic obstructive pulmonary disease with (acute) exacerbation: Secondary | ICD-10-CM | POA: Diagnosis not present

## 2021-08-12 DIAGNOSIS — Z681 Body mass index (BMI) 19 or less, adult: Secondary | ICD-10-CM | POA: Diagnosis not present

## 2021-08-19 DIAGNOSIS — J449 Chronic obstructive pulmonary disease, unspecified: Secondary | ICD-10-CM | POA: Diagnosis not present

## 2021-08-19 DIAGNOSIS — K219 Gastro-esophageal reflux disease without esophagitis: Secondary | ICD-10-CM | POA: Diagnosis not present

## 2021-10-19 DIAGNOSIS — K219 Gastro-esophageal reflux disease without esophagitis: Secondary | ICD-10-CM | POA: Diagnosis not present

## 2021-10-19 DIAGNOSIS — J449 Chronic obstructive pulmonary disease, unspecified: Secondary | ICD-10-CM | POA: Diagnosis not present

## 2021-10-30 ENCOUNTER — Observation Stay (HOSPITAL_COMMUNITY): Payer: Medicare Other

## 2021-10-30 ENCOUNTER — Observation Stay (HOSPITAL_BASED_OUTPATIENT_CLINIC_OR_DEPARTMENT_OTHER): Payer: Medicare Other

## 2021-10-30 ENCOUNTER — Emergency Department (HOSPITAL_COMMUNITY): Payer: Medicare Other

## 2021-10-30 ENCOUNTER — Encounter (HOSPITAL_COMMUNITY): Payer: Self-pay

## 2021-10-30 ENCOUNTER — Other Ambulatory Visit: Payer: Self-pay

## 2021-10-30 ENCOUNTER — Observation Stay (HOSPITAL_COMMUNITY)
Admission: EM | Admit: 2021-10-30 | Discharge: 2021-10-31 | Disposition: A | Discharge status: Home / Self Care | Payer: Medicare Other | Attending: Family Medicine | Admitting: Family Medicine

## 2021-10-30 DIAGNOSIS — J8410 Pulmonary fibrosis, unspecified: Secondary | ICD-10-CM | POA: Diagnosis not present

## 2021-10-30 DIAGNOSIS — E785 Hyperlipidemia, unspecified: Secondary | ICD-10-CM | POA: Diagnosis not present

## 2021-10-30 DIAGNOSIS — I2609 Other pulmonary embolism with acute cor pulmonale: Secondary | ICD-10-CM

## 2021-10-30 DIAGNOSIS — N289 Disorder of kidney and ureter, unspecified: Secondary | ICD-10-CM

## 2021-10-30 DIAGNOSIS — R7989 Other specified abnormal findings of blood chemistry: Secondary | ICD-10-CM | POA: Diagnosis not present

## 2021-10-30 DIAGNOSIS — R0781 Pleurodynia: Secondary | ICD-10-CM

## 2021-10-30 DIAGNOSIS — J449 Chronic obstructive pulmonary disease, unspecified: Secondary | ICD-10-CM | POA: Diagnosis not present

## 2021-10-30 DIAGNOSIS — J42 Unspecified chronic bronchitis: Secondary | ICD-10-CM | POA: Diagnosis not present

## 2021-10-30 DIAGNOSIS — R079 Chest pain, unspecified: Secondary | ICD-10-CM | POA: Diagnosis not present

## 2021-10-30 DIAGNOSIS — Z87891 Personal history of nicotine dependence: Secondary | ICD-10-CM | POA: Diagnosis not present

## 2021-10-30 DIAGNOSIS — R791 Abnormal coagulation profile: Secondary | ICD-10-CM | POA: Insufficient documentation

## 2021-10-30 DIAGNOSIS — Z79899 Other long term (current) drug therapy: Secondary | ICD-10-CM | POA: Insufficient documentation

## 2021-10-30 DIAGNOSIS — J479 Bronchiectasis, uncomplicated: Secondary | ICD-10-CM | POA: Diagnosis not present

## 2021-10-30 DIAGNOSIS — K219 Gastro-esophageal reflux disease without esophagitis: Secondary | ICD-10-CM | POA: Diagnosis present

## 2021-10-30 DIAGNOSIS — I509 Heart failure, unspecified: Secondary | ICD-10-CM | POA: Insufficient documentation

## 2021-10-30 DIAGNOSIS — N189 Chronic kidney disease, unspecified: Secondary | ICD-10-CM | POA: Diagnosis not present

## 2021-10-30 DIAGNOSIS — J439 Emphysema, unspecified: Secondary | ICD-10-CM | POA: Diagnosis not present

## 2021-10-30 DIAGNOSIS — R0789 Other chest pain: Secondary | ICD-10-CM | POA: Diagnosis present

## 2021-10-30 DIAGNOSIS — I2699 Other pulmonary embolism without acute cor pulmonale: Principal | ICD-10-CM | POA: Diagnosis present

## 2021-10-30 LAB — CBC WITH DIFFERENTIAL/PLATELET
Abs Immature Granulocytes: 0.03 10*3/uL (ref 0.00–0.07)
Basophils Absolute: 0.1 10*3/uL (ref 0.0–0.1)
Basophils Relative: 1 %
Eosinophils Absolute: 0.1 10*3/uL (ref 0.0–0.5)
Eosinophils Relative: 1 %
HCT: 43.9 % (ref 39.0–52.0)
Hemoglobin: 14.2 g/dL (ref 13.0–17.0)
Immature Granulocytes: 0 %
Lymphocytes Relative: 16 %
Lymphs Abs: 1.4 10*3/uL (ref 0.7–4.0)
MCH: 29.7 pg (ref 26.0–34.0)
MCHC: 32.3 g/dL (ref 30.0–36.0)
MCV: 91.8 fL (ref 80.0–100.0)
Monocytes Absolute: 0.9 10*3/uL (ref 0.1–1.0)
Monocytes Relative: 10 %
Neutro Abs: 6.5 10*3/uL (ref 1.7–7.7)
Neutrophils Relative %: 72 %
Platelets: 170 10*3/uL (ref 150–400)
RBC: 4.78 MIL/uL (ref 4.22–5.81)
RDW: 13.4 % (ref 11.5–15.5)
WBC: 9.1 10*3/uL (ref 4.0–10.5)
nRBC: 0 % (ref 0.0–0.2)

## 2021-10-30 LAB — BASIC METABOLIC PANEL
Anion gap: 11 (ref 5–15)
BUN: 17 mg/dL (ref 8–23)
CO2: 27 mmol/L (ref 22–32)
Calcium: 9 mg/dL (ref 8.9–10.3)
Chloride: 103 mmol/L (ref 98–111)
Creatinine, Ser: 1.35 mg/dL — ABNORMAL HIGH (ref 0.61–1.24)
GFR, Estimated: 58 mL/min — ABNORMAL LOW (ref >60)
Glucose, Bld: 108 mg/dL — ABNORMAL HIGH (ref 70–99)
Potassium: 4.7 mmol/L (ref 3.5–5.1)
Sodium: 141 mmol/L (ref 135–145)

## 2021-10-30 LAB — ECHOCARDIOGRAM COMPLETE
Area-P 1/2: 3.85 cm2
Calc EF: 48.8 %
Height: 68 in
S' Lateral: 2.7 cm
Single Plane A2C EF: 50.7 %
Single Plane A4C EF: 48.6 %
Weight: 2081.14 oz

## 2021-10-30 LAB — TROPONIN I (HIGH SENSITIVITY)
Troponin I (High Sensitivity): 3 ng/L (ref <18)
Troponin I (High Sensitivity): 2 ng/L (ref <18)

## 2021-10-30 LAB — D-DIMER, QUANTITATIVE: D-Dimer, Quant: 0.93 ug/mL-FEU — ABNORMAL HIGH (ref 0.00–0.50)

## 2021-10-30 LAB — HEPARIN LEVEL (UNFRACTIONATED): Heparin Unfractionated: 0.99 [IU]/mL — ABNORMAL HIGH (ref 0.30–0.70)

## 2021-10-30 MED ORDER — ACETAMINOPHEN 650 MG RE SUPP: 650.0000 mg | Freq: Four times a day (QID) | RECTAL | Status: DC

## 2021-10-30 MED ORDER — ONDANSETRON HCL 4 MG/2ML IJ SOLN: 4.0000 mg | Freq: Four times a day (QID) | INTRAMUSCULAR | Status: DC | PRN

## 2021-10-30 MED ORDER — TECHNETIUM TO 99M ALBUMIN AGGREGATED
4.4000 | Freq: Once | INTRAVENOUS | Status: AC | PRN
  Administered 2021-10-30: 4.4 via INTRAVENOUS

## 2021-10-30 MED ORDER — BUDESON-GLYCOPYRROL-FORMOTEROL 160-9-4.8 MCG/ACT IN AERO: INHALATION_SPRAY | Freq: Every day | RESPIRATORY_TRACT | Status: DC

## 2021-10-30 MED ORDER — FLUTICASONE FUROATE-VILANTEROL 200-25 MCG/ACT IN AEPB
1.0000 | INHALATION_SPRAY | Freq: Every day | RESPIRATORY_TRACT | Status: DC
  Administered 2021-10-31: 1 via RESPIRATORY_TRACT
  Filled 2021-10-30: qty 28

## 2021-10-30 MED ORDER — HEPARIN BOLUS VIA INFUSION
5000.0000 [IU] | Freq: Once | INTRAVENOUS | Status: AC
  Administered 2021-10-30: 5000 [IU] via INTRAVENOUS

## 2021-10-30 MED ORDER — SODIUM CHLORIDE 0.9 % IV SOLN: INTRAVENOUS | Status: DC

## 2021-10-30 MED ORDER — UMECLIDINIUM BROMIDE 62.5 MCG/ACT IN AEPB
1.0000 | INHALATION_SPRAY | Freq: Every day | RESPIRATORY_TRACT | Status: DC
  Administered 2021-10-31: 1 via RESPIRATORY_TRACT
  Filled 2021-10-30: qty 7

## 2021-10-30 MED ORDER — ACETAMINOPHEN 325 MG PO TABS
650.0000 mg | ORAL_TABLET | Freq: Once | ORAL | Status: AC
  Administered 2021-10-30: 650 mg via ORAL
  Filled 2021-10-30: qty 2

## 2021-10-30 MED ORDER — TRAZODONE HCL 50 MG PO TABS: 25.0000 mg | ORAL_TABLET | Freq: Every evening | ORAL | Status: DC | PRN

## 2021-10-30 MED ORDER — ONDANSETRON HCL 4 MG PO TABS: 4.0000 mg | ORAL_TABLET | Freq: Four times a day (QID) | ORAL | Status: DC | PRN

## 2021-10-30 MED ORDER — OXYCODONE HCL 5 MG PO TABS: 5.0000 mg | ORAL_TABLET | ORAL | Status: DC | PRN

## 2021-10-30 MED ORDER — ALBUTEROL SULFATE 2 MG PO TABS
4.0000 mg | ORAL_TABLET | Freq: Three times a day (TID) | ORAL | Status: DC
  Administered 2021-10-30 – 2021-10-31 (×2): 4 mg via ORAL
  Filled 2021-10-30 (×6): qty 1

## 2021-10-30 MED ORDER — ROSUVASTATIN CALCIUM 10 MG PO TABS
10.0000 mg | ORAL_TABLET | Freq: Every evening | ORAL | Status: DC
  Administered 2021-10-30: 10 mg via ORAL
  Filled 2021-10-30: qty 1

## 2021-10-30 MED ORDER — PANTOPRAZOLE SODIUM 40 MG PO TBEC
40.0000 mg | DELAYED_RELEASE_TABLET | Freq: Two times a day (BID) | ORAL | Status: DC
  Administered 2021-10-30 – 2021-10-31 (×2): 40 mg via ORAL
  Filled 2021-10-30 (×2): qty 1

## 2021-10-30 MED ORDER — ALBUTEROL SULFATE (2.5 MG/3ML) 0.083% IN NEBU: 2.5000 mg | INHALATION_SOLUTION | Freq: Four times a day (QID) | RESPIRATORY_TRACT | Status: DC | PRN

## 2021-10-30 MED ORDER — BISACODYL 5 MG PO TBEC: 5.0000 mg | DELAYED_RELEASE_TABLET | Freq: Every day | ORAL | Status: DC | PRN

## 2021-10-30 MED ORDER — ACETAMINOPHEN 325 MG PO TABS
650.0000 mg | ORAL_TABLET | Freq: Four times a day (QID) | ORAL | Status: DC
  Administered 2021-10-30 – 2021-10-31 (×3): 650 mg via ORAL
  Filled 2021-10-30 (×3): qty 2

## 2021-10-30 MED ORDER — FENTANYL CITRATE PF 50 MCG/ML IJ SOSY: 12.5000 ug | PREFILLED_SYRINGE | INTRAMUSCULAR | Status: DC | PRN

## 2021-10-30 MED ORDER — HEPARIN (PORCINE) 25000 UT/250ML-% IV SOLN
900.0000 [IU]/h | INTRAVENOUS | Status: DC
  Administered 2021-10-30: 1050 [IU]/h via INTRAVENOUS
  Filled 2021-10-30: qty 250

## 2021-10-30 NOTE — Progress Notes (Signed)
ANTICOAGULATION CONSULT NOTE - Initial Consult  Pharmacy Consult for Heparin Indication: R/O pulmonary embolus  Allergies  Allergen Reactions   Penicillins     Has patient had a PCN reaction causing immediate rash, facial/tongue/throat swelling, SOB or lightheadedness with hypotension: No Has patient had a PCN reaction causing severe rash involving mucus membranes or skin necrosis: No Has patient had a PCN reaction that required hospitalization: No Has patient had a PCN reaction occurring within the last 10 years: No If all of the above answers are "NO", then may proceed with Cephalosporin use. Patient passes out   Yellow Jacket Venom [Bee Venom] Swelling    Patient Measurements: Height: '5\' 8"'$  (172.7 cm) Weight: 59 kg (130 lb 1.1 oz) IBW/kg (Calculated) : 68.4 Heparin Dosing Weight: 59  Vital Signs: Temp: 97.7 F (36.5 C) (10/11 0810) Temp Source: Oral (10/11 0810) BP: 129/86 (10/11 1000) Pulse Rate: 60 (10/11 1000)  Labs: Recent Labs    10/30/21 0239 10/30/21 0246 10/30/21 0414  HGB 14.2  --   --   HCT 43.9  --   --   PLT 170  --   --   CREATININE 1.35*  --   --   TROPONINIHS  --  3 2    Estimated Creatinine Clearance: 44.9 mL/min (A) (by C-G formula based on SCr of 1.35 mg/dL (H)).  Assessment: 66 year old male with hx PE admitted with SOB and high suspicion for pulmonary embolism. Pharmacy consulted to assist with systemic anticoagulation. Baseline CBC wnl, some renal compromise noted.  Goal of Therapy:  Heparin level 0.3-0.7 units/ml Monitor platelets by anticoagulation protocol: Yes   Plan:  Give 5000 units bolus x 1 Start heparin infusion at 1050 units/hr Check anti-Xa level in 8 hours and daily while on heparin Continue to monitor H&H and platelets  Urias Sheek Chika 10/30/2021,12:08 PM

## 2021-10-30 NOTE — Hospital Course (Signed)
66 y/o male former smoker, with COPD with chronic cough, GERD, hyperlipidemia, constipation presented to ED with complaints of sharp left sided chest wall pain that started 1 day prior to arrival.  He describes that pain as a dull ache with intermittent sharp features that is present constantly but worse with deep breathing.  He denies SOB.  He denies fever and chills.  He denies nausea, vomiting and abdominal pain.  He was noted to have an elevated D dimer.  CT scanner is not working at this hospital and he was sent for a V/Q scan which did come back as high probability of acute pulmonary embolus on left side.  He was started on IV heparin and admission was requested.

## 2021-10-30 NOTE — ED Provider Notes (Signed)
  Physical Exam  BP 136/87 (BP Location: Left Arm)   Pulse 93   Temp 97.6 F (36.4 C) (Oral)   Resp 18   Ht '5\' 8"'$  (1.727 m)   Wt 59 kg   SpO2 96%   BMI 19.78 kg/m   Physical Exam Constitutional:      General: He is not in acute distress.    Appearance: Normal appearance.  HENT:     Head: Normocephalic and atraumatic.     Nose: No congestion or rhinorrhea.  Eyes:     General:        Right eye: No discharge.        Left eye: No discharge.     Extraocular Movements: Extraocular movements intact.     Pupils: Pupils are equal, round, and reactive to light.  Cardiovascular:     Rate and Rhythm: Normal rate and regular rhythm.     Heart sounds: No murmur heard. Pulmonary:     Effort: No respiratory distress.     Breath sounds: No wheezing or rales.  Abdominal:     General: There is no distension.     Tenderness: There is no abdominal tenderness.  Musculoskeletal:        General: Normal range of motion.     Cervical back: Normal range of motion.  Skin:    General: Skin is warm and dry.  Neurological:     General: No focal deficit present.     Mental Status: He is alert.     Procedures  .Critical Care  Performed by: Teressa Lower, MD Authorized by: Teressa Lower, MD   Critical care provider statement:    Critical care time (minutes):  30   Critical care was necessary to treat or prevent imminent or life-threatening deterioration of the following conditions:  Respiratory failure (pulmonary embolism requiring heparin)   Critical care was time spent personally by me on the following activities:  Development of treatment plan with patient or surrogate, discussions with consultants, evaluation of patient's response to treatment, examination of patient, ordering and review of laboratory studies, ordering and review of radiographic studies, ordering and performing treatments and interventions, pulse oximetry, re-evaluation of patient's condition and review of old  charts   ED Course / MDM   Clinical Course as of 10/30/21 2041  Wed Oct 30, 2021  0709 Pending VQ [MK]    Clinical Course User Index [MK] Teressa Lower, MD   Medical Decision Making Amount and/or Complexity of Data Reviewed Labs: ordered. Radiology: ordered.  Risk OTC drugs. Decision regarding hospitalization.   Patient received in handoff.  Concern for PE given elevated D-dimer but CT scanner not available.  Pending VQ scan at time of signout.  VQ scan is actually concerning for pulmonary embolism at this time.  Patient placed on heparin and admitted       Teressa Lower, MD 10/30/21 2043

## 2021-10-30 NOTE — Progress Notes (Signed)
ANTICOAGULATION CONSULT NOTE - Initial Consult  Pharmacy Consult for Heparin Indication: R/O pulmonary embolus  Allergies  Allergen Reactions   Penicillins     Has patient had a PCN reaction causing immediate rash, facial/tongue/throat swelling, SOB or lightheadedness with hypotension: No Has patient had a PCN reaction causing severe rash involving mucus membranes or skin necrosis: No Has patient had a PCN reaction that required hospitalization: No Has patient had a PCN reaction occurring within the last 10 years: No If all of the above answers are "NO", then may proceed with Cephalosporin use. Patient passes out   Yellow Jacket Venom [Bee Venom] Swelling    Patient Measurements: Height: '5\' 8"'$  (172.7 cm) Weight: 59 kg (130 lb 1.1 oz) IBW/kg (Calculated) : 68.4 Heparin Dosing Weight: 59  Vital Signs: Temp: 97.5 F (36.4 C) (10/11 2048) Temp Source: Oral (10/11 1725) BP: 126/72 (10/11 2048) Pulse Rate: 88 (10/11 2048)  Labs: Recent Labs    10/30/21 0239 10/30/21 0246 10/30/21 0414 10/30/21 2110  HGB 14.2  --   --   --   HCT 43.9  --   --   --   PLT 170  --   --   --   HEPARINUNFRC  --   --   --  0.99*  CREATININE 1.35*  --   --   --   TROPONINIHS  --  3 2  --      Estimated Creatinine Clearance: 44.9 mL/min (A) (by C-G formula based on SCr of 1.35 mg/dL (H)).  Assessment: 66 year old male with hx PE admitted with SOB and high suspicion for pulmonary embolism. Pharmacy consulted to assist with systemic anticoagulation. Baseline CBC wnl, some renal compromise noted.  HL 0.99 (supra-therapeutic) No issues and no s/sx bleeding per RN.   Goal of Therapy:  Heparin level 0.3-0.7 units/ml Monitor platelets by anticoagulation protocol: Yes   Plan:  Decrease heparin infusion to 900 units/hr Check anti-Xa level '@0500'$  and daily while on heparin Continue to monitor H&H and platelets, s/sx bleeding  F/u transition to enteral therapies as appropriate  Wilson Singer,  PharmD Clinical Pharmacist 10/30/2021 9:44 PM

## 2021-10-30 NOTE — ED Triage Notes (Addendum)
Pov from home. Cc of right side chest pain for a few days. Hurts when he lays on it. But sitting up it does not . Sees [facility] on the 20th for this. Says it is probably pneumonia and that normally he gets a shot and a Z-pack. 93% after ambulating to tx room.  Hx of CHF

## 2021-10-30 NOTE — H&P (Signed)
History and Physical  Robert Duarte:[id number] DOB: 07/24/1955 DOA: 10/30/2021  PCP: Sharilyn Sites, MD  Patient coming from: Home  Level of care: Telemetry  I have personally briefly reviewed patient's old medical records in [facility]arte  Chief Complaint: Chest Pain   HPI: Robert Duarte is a 66 y/o male former smoker, with COPD with chronic cough, GERD, hyperlipidemia, constipation presented to ED with complaints of sharp left sided chest wall pain that started 1 day prior to arrival.  He describes that pain as a dull ache with intermittent sharp features that is present constantly but worse with deep breathing.  He denies SOB.  He denies fever and chills.  He denies nausea, vomiting and abdominal pain.  He was noted to have an elevated D dimer.  CT scanner is not working at this hospital and he was sent for a V/Q scan which did come back as high probability of acute pulmonary embolus on left side.  He was started on IV heparin and admission was requested.     Past Medical History:  Diagnosis Date   Arthritis    CHF (congestive heart failure) ([place])    Emphysema     Past Surgical History:  Procedure Laterality Date   COLONOSCOPY  2002   internal hemorrhoids   TONSILLECTOMY       reports that he quit smoking about 4 years ago. His smoking use included cigarettes. He has a 29.00 pack-year smoking history. He has never used smokeless tobacco. He reports that he does not drink alcohol and does not use drugs.  Allergies  Allergen Reactions   Penicillins     Has patient had a PCN reaction causing immediate rash, facial/tongue/throat swelling, SOB or lightheadedness with hypotension: No Has patient had a PCN reaction causing severe rash involving mucus membranes or skin necrosis: No Has patient had a PCN reaction that required hospitalization: No Has patient had a PCN reaction occurring within the last 10 years: No If all of the above answers are "NO",  then may proceed with Cephalosporin use. Patient passes out   Yellow Jacket Venom [Bee Venom] Swelling    Family History  Problem Relation Age of Onset   Lung cancer Father        deceased   Heart disease Mother        heart valve replacement   Colon cancer Neg Hx    Liver disease Neg Hx     Prior to Admission medications   Medication Sig Start Date End Date Taking? Authorizing Provider  albuterol (PROVENTIL HFA;VENTOLIN HFA) 108 (90 Base) MCG/ACT inhaler Inhale 2 puffs into the lungs every 4 (four) hours as needed for wheezing or shortness of breath. 03/06/18  Yes Noemi Chapel, MD  albuterol (PROVENTIL) 4 MG tablet Take 1 tablet by mouth 3 (three) times daily. 10/28/21  Yes [provider]  Budeson-Glycopyrrol-Formoterol (BREZTRI AEROSPHERE) 160-9-4.8 MCG/ACT AERO Inhale 1 Dose into the lungs daily.   Yes [provider]  pantoprazole (PROTONIX) 40 MG tablet Take 40 mg by mouth 2 (two) times daily. 10/08/21  Yes [provider]  rosuvastatin (CRESTOR) 10 MG tablet Take 10 mg by mouth daily. 07/02/21  Yes [provider]  albuterol (PROVENTIL) (5 MG/ML) 0.5% nebulizer solution Take 0.5 mLs (2.5 mg total) by nebulization every 6 (six) hours as needed for wheezing or shortness of breath. Patient not taking: Reported on 10/30/2021 05/05/18   Ezequiel Essex, MD    Physical Exam:  Vitals:   10/30/21 1000 10/30/21 1215 10/30/21 1217 10/30/21 1230  BP: 129/86 100/81  (!) 145/95  Pulse: 60 72  79  Resp: 11 (!) 21  12  Temp:   97.7 F (36.5 C)   TempSrc:   Oral   SpO2: 97% 100%  98%  Weight:      Height:        Constitutional: thin frail elderly male, appears older than stated age, NAD, calm, comfortable Eyes: PERRL, lids and conjunctivae normal ENMT: Mucous membranes are dry. Posterior pharynx clear of any exudate or lesions. Poor dentition.  Neck: normal, supple, no masses, no thyromegaly Respiratory: clear to auscultation bilaterally, no  wheezing, no crackles. Normal respiratory effort. No accessory muscle use.  Cardiovascular: normal s1, s2 sounds, no murmurs / rubs / gallops. No extremity edema. 2+ pedal pulses. No carotid bruits.  Abdomen: no tenderness, no masses palpated. No hepatosplenomegaly. Bowel sounds positive.  Musculoskeletal: no clubbing / cyanosis. No joint deformity upper and lower extremities. Good ROM, no contractures. Normal muscle tone.  Skin: no rashes, lesions, ulcers. No induration Neurologic: CN 2-12 grossly intact. Sensation intact, DTR normal. Strength 5/5 in all 4.  Psychiatric: Normal judgment and insight. Alert and oriented x 3. Normal mood.   Labs on Admission: I have personally reviewed following labs and imaging studies  CBC: Recent Labs  Lab 10/30/21 0239  WBC 9.1  NEUTROABS 6.5  HGB 14.2  HCT 43.9  MCV 91.8  PLT 299   Basic Metabolic Panel: Recent Labs  Lab 10/30/21 0239  NA 141  K 4.7  CL 103  CO2 27  GLUCOSE 108*  BUN 17  CREATININE 1.35*  CALCIUM 9.0   GFR: Estimated Creatinine Clearance: 44.9 mL/min (A) (by C-G formula based on SCr of 1.35 mg/dL (H)). Liver Function Tests: No results for input(s): "AST", "ALT", "ALKPHOS", "BILITOT", "PROT", "ALBUMIN" in the last 168 hours. No results for input(s): "LIPASE", "AMYLASE" in the last 168 hours. No results for input(s): "AMMONIA" in the last 168 hours. Coagulation Profile: No results for input(s): "INR", "PROTIME" in the last 168 hours. Cardiac Enzymes: No results for input(s): "CKTOTAL", "CKMB", "CKMBINDEX", "TROPONINI" in the last 168 hours. BNP (last 3 results) No results for input(s): "PROBNP" in the last 8760 hours. HbA1C: No results for input(s): "HGBA1C" in the last 72 hours. CBG: No results for input(s): "GLUCAP" in the last 168 hours. Lipid Profile: No results for input(s): "CHOL", "HDL", "LDLCALC", "TRIG", "CHOLHDL", "LDLDIRECT" in the last 72 hours. Thyroid Function Tests: No results for input(s): "TSH",  "T4TOTAL", "FREET4", "T3FREE", "THYROIDAB" in the last 72 hours. Anemia Panel: No results for input(s): "VITAMINB12", "FOLATE", "FERRITIN", "TIBC", "IRON", "RETICCTPCT" in the last 72 hours. Urine analysis: No results found for: "COLORURINE", "APPEARANCEUR", "LABSPEC", "PHURINE", "GLUCOSEU", "HGBUR", "BILIRUBINUR", "KETONESUR", "PROTEINUR", "UROBILINOGEN", "NITRITE", "LEUKOCYTESUR"  Radiological Exams on Admission: NM Pulmonary Perfusion  Result Date: 10/30/2021 CLINICAL DATA:  Elevated D-dimer, chest pain for 2 days, COPD, question pulmonary embolism EXAM: NUCLEAR MEDICINE PERFUSION LUNG SCAN TECHNIQUE: Perfusion images were obtained in multiple projections after intravenous injection of radiopharmaceutical. Ventilation scans intentionally deferred if perfusion scan and chest x-ray adequate for interpretation during COVID 19 epidemic. RADIOPHARMACEUTICALS:  4.4 mCi Tc-30mMAA IV COMPARISON:  Chest radiograph 10/30/2021 FINDINGS: Diffusely diminished perfusion throughout the upper lobes bilaterally as well as portions of the RIGHT middle and RIGHT lower lobes. These areas of diminished perfusion are nonsegmental in appearance and consistent with the severe emphysematous changes identified on chest radiographs. A single wedge-shaped perfusion  defect is seen at the superior segment of the LEFT lower lobe. IMPRESSION: Diffusely abnormal perfusion throughout both lungs greatest in the upper lobes RIGHT greater than LEFT consistent with the severe emphysematous changes present on chest radiographs. Single large subsegmental defect superior segment LEFT lower lobe, concerning for pulmonary embolism. Findings discussed with Dr. Matilde Sprang on 10/30/2021 at 1125 hours. Electronically Signed   By: Lavonia Dana M.D.   On: 10/30/2021 11:25   DG Chest 2 View  Result Date: 10/30/2021 CLINICAL DATA:  Chest pain EXAM: CHEST - 2 VIEW COMPARISON:  08/07/2021 FINDINGS: Heart size and pulmonary vascularity are normal.  Emphysematous changes in the lungs. Scattered pulmonary fibrosis and bronchiectasis most prominent in the apices. No airspace disease or consolidation. No pleural effusions. No pneumothorax. Mediastinal contours appear intact. IMPRESSION: Emphysematous changes and fibrosis in the lungs. No focal consolidation. Electronically Signed   By: Lucienne Capers M.D.   On: 10/30/2021 02:18    EKG: Independently reviewed. No acute ST-T wave abnormalities  Assessment/Plan Principal Problem:   Acute pulmonary embolism (HCC) Active Problems:   Pleuritic chest pain   Elevated d-dimer   Hyperlipidemia   GERD (gastroesophageal reflux disease)   Former smoker   COPD (chronic obstructive pulmonary disease) (HCC)    Acute Pulmonary Embolus  - VQ scan suggesting high probability of PE - elevated D dimer and symptoms support diagnosis - check venous dopplers of lower extremities - check TTE  - continue IV heparin overnight, plan to transition to DOAC in AM 10/12 - consider TED hose if DVT confirmed in lower extremity - bleeding precautions while on anticoagulant therapy   Pleuritic chest pain  - likely due to acute PE - treating supportively  - acetaminophen around the clock  - HS troponins very reassuring, do not suspect ACS  Hyperlipidemia - resume home rosuvastatin every evening  COPD  - resume home albuterol tablets and bronchodilators  GERD - resume home protonix 40 mg BID  DVT prophylaxis: IV heparin infusion   Code Status: Full   Family Communication: n/a   Disposition Plan: anticipate home in 24 hours   Consults called:   Admission status: OBS   Level of care: Telemetry Irwin Brakeman MD [facility] How to contact the [facility] Attending or Consulting provider [place] or covering provider during after hours 7P -7A, for this patient?  Check the care team in [facility] and look for a) attending/consulting [facility] provider listed and b) the [facility] team listed Log into [email or web address] and use  [place]'s universal password to access. If you do not have the password, please contact the hospital operator. Locate the [facility] provider you are looking for under [facility] and page to a number that you can be directly reached. If you still have difficulty reaching the provider, please page the [facility] (Director on Call) for the Hospitalists listed on amion for assistance.   If 7PM-7AM, please contact night-coverage [email or web address] Password [email or web address]  10/30/2021, 12:45 PM

## 2021-10-30 NOTE — ED Provider Notes (Signed)
[facility] EMERGENCY DEPARTMENT Provider Note   CSN: [id number] Arrival date & time: 10/30/21  0003     History  Chief Complaint  Patient presents with   Chest Pain    Robert Duarte is a 66 y.o. male.  The history is provided by the patient.  Chest Pain He has history of COPD, and comes in complaining of right-sided chest pain which started yesterday.  It is a dull ache which is fairly constant, but is sharp if he rolls over in bed and also is sometimes worse with taking a deep breath.  There is no change in his baseline dyspnea and he denies nausea or diaphoresis.  He has a chronic cough which is productive of yellow sputum and it has not changed with onset of this pain.  He denies nausea, vomiting.  He has not taken anything for his pain.  He is currently a non-smoker having quit 40 years ago.  He denies history of hypertension, diabetes, hyperlipidemia and denies family history of premature coronary atherosclerosis.   Home Medications Prior to Admission medications   Medication Sig Start Date End Date Taking? Authorizing Provider  albuterol (PROVENTIL HFA;VENTOLIN HFA) 108 (90 Base) MCG/ACT inhaler Inhale 2 puffs into the lungs every 4 (four) hours as needed for wheezing or shortness of breath. 03/06/18   Noemi Chapel, MD  albuterol (PROVENTIL) (5 MG/ML) 0.5% nebulizer solution Take 0.5 mLs (2.5 mg total) by nebulization every 6 (six) hours as needed for wheezing or shortness of breath. 05/05/18   Rancour, Annie Main, MD  Budeson-Glycopyrrol-Formoterol (BREZTRI AEROSPHERE) 160-9-4.8 MCG/ACT AERO Inhale 1 Dose into the lungs daily.    [provider]  doxycycline (VIBRAMYCIN) 100 MG capsule Take 1 capsule (100 mg total) by mouth 2 (two) times daily. 05/05/18   Rancour, Annie Main, MD  levofloxacin (LEVAQUIN) 750 MG tablet Take 1 tablet (750 mg total) by mouth daily. 06/05/19   Margarita Mail, PA-C      Allergies    Penicillins and Yellow jacket venom [bee venom]    Review  of Systems   Review of Systems  Cardiovascular:  Positive for chest pain.  All other systems reviewed and are negative.   Physical Exam Updated Vital Signs BP 126/82   Pulse 87   Resp 13   Ht '5\' 8"'$  (1.727 m)   Wt 59 kg   SpO2 96%   BMI 19.78 kg/m  Physical Exam Vitals and nursing note reviewed.   66 year old male, resting comfortably and in no acute distress. Vital signs are normal. Oxygen saturation is 96%, which is normal. Head is normocephalic and atraumatic. PERRLA, EOMI. Oropharynx is clear. Neck is nontender and supple without adenopathy or JVD. Back is nontender and there is no CVA tenderness. Lungs have diminished airflow diffusely without rales, wheezes, or rhonchi. Chest is has mild tenderness in the left anterior lower rib cage.  There is no crepitus. Heart has regular rate and rhythm without murmur. Abdomen is soft, flat, nontender. Extremities have no cyanosis or edema, full range of motion is present. Skin is warm and dry without rash. Neurologic: Mental status is normal, cranial nerves are intact, moves all extremities equally.  ED Results / Procedures / Treatments   Labs (all labs ordered are listed, but only abnormal results are displayed) Labs Reviewed - No data to display  EKG EKG Interpretation  Date/Time:  Wednesday October 30 2021 01:24:23 EDT Ventricular Rate:  87 PR Interval:  163 QRS Duration: 100 QT Interval:  385 QTC  Calculation: 464 R Axis:   48 Text Interpretation: Sinus rhythm Right atrial enlargement Anteroseptal infarct, age indeterminate When compared with ECG of 08/07/2021, No significant change was found Confirmed by Delora Fuel ([id number]) on 10/30/2021 1:27:58 AM  Radiology DG Chest 2 View  Result Date: 10/30/2021 CLINICAL DATA:  Chest pain EXAM: CHEST - 2 VIEW COMPARISON:  08/07/2021 FINDINGS: Heart size and pulmonary vascularity are normal. Emphysematous changes in the lungs. Scattered pulmonary fibrosis and bronchiectasis most  prominent in the apices. No airspace disease or consolidation. No pleural effusions. No pneumothorax. Mediastinal contours appear intact. IMPRESSION: Emphysematous changes and fibrosis in the lungs. No focal consolidation. Electronically Signed   By: Lucienne Capers M.D.   On: 10/30/2021 02:18    Procedures Procedures  Cardiac monitor shows normal sinus rhythm, per my interpretation.  Medications Ordered in ED Medications  acetaminophen (TYLENOL) tablet 650 mg (has no administration in time range)    ED Course/ Medical Decision Making/ A&P Clinical Course as of 10/30/21 0745  Wed Oct 30, 2021  0709 Pending VQ [MK]    Clinical Course User Index [MK] Kommor, Debe Coder, MD                           Medical Decision Making Amount and/or Complexity of Data Reviewed Labs: ordered. Radiology: ordered.  Risk OTC drugs.   Chest pain which is somewhat atypical, suspect musculoskeletal pain.  Differential diagnosis includes, but is not not limited to, pneumonia, COPD exacerbation, pulmonary embolism.  Pain is very atypical and doubt ACS.  I have reviewed and interpreted his electrocardiogram, and my interpretation is right atrial enlargement but no ST or T changes and unchanged from prior.  Chest x-ray shows COPD and fibrosis without evidence of pneumonia.  I have independently viewed the images, and agree with the radiologist's interpretation.  I have ordered a dose of acetaminophen and I have ordered blood tests including CBC, basic metabolic panel, troponin, D-dimer.  I have reviewed his old records, and note several ED visits for COPD exacerbation but for any cardiac issues and no cardiovascular procedures on record.  Before troponin results, heart score is 2, which puts him at low risk for major adverse cardiac events in the next 30 days.  I have reviewed and interpreted his laboratory tests, and my interpretation is stable renal insufficiency, normal troponin x2, normal CBC, elevated  D-dimer.  Pulmonary embolism is a significant diagnostic consideration given his presentation.  Unfortunately, CT scanner is currently not available.  I have ordered a nuclear medicine lung scan to rule out pulmonary embolism.  Case is signed out to Dr. Matilde Sprang.  Final Clinical Impression(s) / ED Diagnoses Final diagnoses:  Pleuritic chest pain  Renal insufficiency  D-dimer, elevated    Rx / DC Orders ED Discharge Orders     None         Delora Fuel, MD 26/71/24 [id number]

## 2021-10-30 NOTE — Progress Notes (Signed)
Echocardiogram 2D Echocardiogram has been performed.  Robert Duarte 10/30/2021, 2:37 PM

## 2021-10-31 DIAGNOSIS — J42 Unspecified chronic bronchitis: Secondary | ICD-10-CM | POA: Diagnosis not present

## 2021-10-31 DIAGNOSIS — I2699 Other pulmonary embolism without acute cor pulmonale: Secondary | ICD-10-CM | POA: Diagnosis not present

## 2021-10-31 DIAGNOSIS — R0781 Pleurodynia: Secondary | ICD-10-CM | POA: Diagnosis not present

## 2021-10-31 DIAGNOSIS — Z87891 Personal history of nicotine dependence: Secondary | ICD-10-CM | POA: Diagnosis not present

## 2021-10-31 DIAGNOSIS — R7989 Other specified abnormal findings of blood chemistry: Secondary | ICD-10-CM | POA: Diagnosis not present

## 2021-10-31 LAB — CBC
HCT: 39.9 % (ref 39.0–52.0)
Hemoglobin: 13.4 g/dL (ref 13.0–17.0)
MCH: 30.4 pg (ref 26.0–34.0)
MCHC: 33.6 g/dL (ref 30.0–36.0)
MCV: 90.5 fL (ref 80.0–100.0)
Platelets: 183 10*3/uL (ref 150–400)
RBC: 4.41 MIL/uL (ref 4.22–5.81)
RDW: 13.2 % (ref 11.5–15.5)
WBC: 6.2 10*3/uL (ref 4.0–10.5)
nRBC: 0 % (ref 0.0–0.2)

## 2021-10-31 LAB — BASIC METABOLIC PANEL
Anion gap: 7 (ref 5–15)
BUN: 20 mg/dL (ref 8–23)
CO2: 23 mmol/L (ref 22–32)
Calcium: 8.3 mg/dL — ABNORMAL LOW (ref 8.9–10.3)
Chloride: 107 mmol/L (ref 98–111)
Creatinine, Ser: 1.17 mg/dL (ref 0.61–1.24)
GFR, Estimated: >60 mL/min (ref >60)
Glucose, Bld: 87 mg/dL (ref 70–99)
Potassium: 4.2 mmol/L (ref 3.5–5.1)
Sodium: 137 mmol/L (ref 135–145)

## 2021-10-31 LAB — HEPARIN LEVEL (UNFRACTIONATED): Heparin Unfractionated: 0.75 IU/mL — ABNORMAL HIGH (ref 0.30–0.70)

## 2021-10-31 LAB — MAGNESIUM: Magnesium: 2.4 mg/dL (ref 1.7–2.4)

## 2021-10-31 LAB — HIV ANTIBODY (ROUTINE TESTING W REFLEX): HIV Screen 4th Generation wRfx: NONREACTIVE

## 2021-10-31 MED ORDER — APIXABAN 5 MG PO TABS: ORAL_TABLET | ORAL | 2 refills | Status: DC

## 2021-10-31 MED ORDER — APIXABAN 5 MG PO TABS: 5.0000 mg | ORAL_TABLET | Freq: Two times a day (BID) | ORAL | Status: DC

## 2021-10-31 MED ORDER — APIXABAN 5 MG PO TABS
10.0000 mg | ORAL_TABLET | Freq: Two times a day (BID) | ORAL | Status: DC
  Administered 2021-10-31: 10 mg via ORAL
  Filled 2021-10-31 (×2): qty 2

## 2021-10-31 MED ORDER — ACETAMINOPHEN 325 MG PO TABS: 650.0000 mg | ORAL_TABLET | Freq: Four times a day (QID) | ORAL | Status: AC | PRN

## 2021-10-31 NOTE — Discharge Instructions (Signed)

## 2021-10-31 NOTE — Progress Notes (Signed)
Patient slept this shift on an off, only waking for vitals and medications. No complaints of pain.

## 2021-10-31 NOTE — Discharge Summary (Signed)
Physician Discharge Summary  Robert Duarte YBO:[id number] DOB: May 02, 1955 DOA: 10/30/2021  PCP: Sharilyn Sites, MD  Admit date: 10/30/2021 Discharge date: 10/31/2021  Admitted From:  Home  Disposition:  Home   Recommendations for Outpatient Follow-up:  Follow up with PCP in 1 weeks Bleeding precautions while on anticoagulant therapy Please continue apixaban for at least 3 months   Discharge Condition: STABLE   CODE STATUS: FULL DIET: regular    Brief Hospitalization Summary: Please see all hospital notes, images, labs for full details of the hospitalization. 66 y/o male former smoker, with COPD with chronic cough, GERD, hyperlipidemia, constipation presented to ED with complaints of sharp left sided chest wall pain that started 1 day prior to arrival.  He describes that pain as a dull ache with intermittent sharp features that is present constantly but worse with deep breathing.  He denies SOB.  He denies fever and chills.  He denies nausea, vomiting and abdominal pain.  He was noted to have an elevated D dimer.  CT scanner is not working at this hospital and he was sent for a V/Q scan which did come back as high probability of acute pulmonary embolus on left side.  He was started on IV heparin and admission was requested.    Hospital Course by Problem list   Acute Pulmonary Embolus  - VQ scan suggesting high probability of PE - elevated D dimer and symptoms support diagnosis - check venous dopplers of lower extremities: no VTE found. - check TTE --> reassuring, see below  - treated with IV heparin overnight, transitioned to DOAC in AM 10/12 - discussed with pharm D, apixaban 10 mg BID x 7 days followed by 5 mg BID - bleeding precautions while on anticoagulant therapy  - pt to discuss with PCP length of therapy but at least 3 months advised   Pleuritic chest pain - resolved  - likely due to acute PE - treating supportively  - acetaminophen around the clock  - HS troponins  very reassuring, do not suspect ACS - pain resolved after anticoagulation started    Hyperlipidemia - resume home rosuvastatin every evening   COPD  - resume home albuterol tablets and bronchodilators   GERD - resume home protonix 40 mg BID   DVT prophylaxis: IV heparin infusion --> apixaban  Code Status: Full   Family Communication: n/a   Disposition Plan: home    Consults called:   Admission status: OBS    Discharge Diagnoses:  Principal Problem:   Acute pulmonary embolism ([place]) Active Problems:   Pleuritic chest pain   Elevated d-dimer   Hyperlipidemia   GERD (gastroesophageal reflux disease)   Former smoker   COPD (chronic obstructive pulmonary disease) ([place])   Discharge Instructions:  Allergies as of 10/31/2021       Reactions   Penicillins    Has patient had a PCN reaction causing immediate rash, facial/tongue/throat swelling, SOB or lightheadedness with hypotension: No Has patient had a PCN reaction causing severe rash involving mucus membranes or skin necrosis: No Has patient had a PCN reaction that required hospitalization: No Has patient had a PCN reaction occurring within the last 10 years: No If all of the above answers are "NO", then may proceed with Cephalosporin use. Patient passes out   Yellow Jacket Venom [bee Venom] Swelling        Medication List     TAKE these medications    acetaminophen 325 MG tablet Commonly known as: TYLENOL Take 2 tablets (650  mg total) by mouth every 6 (six) hours as needed for fever, moderate pain, mild pain or headache.   albuterol 108 (90 Base) MCG/ACT inhaler Commonly known as: VENTOLIN HFA Inhale 2 puffs into the lungs every 4 (four) hours as needed for wheezing or shortness of breath. What changed: Another medication with the same name was removed. Continue taking this medication, and follow the directions you see here.   albuterol 4 MG tablet Commonly known as: PROVENTIL Take 1 tablet by mouth 3 (three)  times daily. What changed: Another medication with the same name was removed. Continue taking this medication, and follow the directions you see here.   apixaban 5 MG Tabs tablet Commonly known as: ELIQUIS 2 po BID x 7 days, then 1 po BID Start taking on: November 07, 2021   Breztri Aerosphere 160-9-4.8 MCG/ACT Aero Generic drug: Budeson-Glycopyrrol-Formoterol Inhale 1 Dose into the lungs daily.   pantoprazole 40 MG tablet Commonly known as: PROTONIX Take 40 mg by mouth 2 (two) times daily.   rosuvastatin 10 MG tablet Commonly known as: CRESTOR Take 10 mg by mouth daily.        Follow-up Information     Sharilyn Sites, MD. Schedule an appointment as soon as possible for a visit in 1 week(s).   Specialty: Family Medicine Why: Hospital Follow Up Contact information: [place] [address] [phone]                Allergies  Allergen Reactions   Penicillins     Has patient had a PCN reaction causing immediate rash, facial/tongue/throat swelling, SOB or lightheadedness with hypotension: No Has patient had a PCN reaction causing severe rash involving mucus membranes or skin necrosis: No Has patient had a PCN reaction that required hospitalization: No Has patient had a PCN reaction occurring within the last 10 years: No If all of the above answers are "NO", then may proceed with Cephalosporin use. Patient passes out   Yellow Jacket Venom [Bee Venom] Swelling   Allergies as of 10/31/2021       Reactions   Penicillins    Has patient had a PCN reaction causing immediate rash, facial/tongue/throat swelling, SOB or lightheadedness with hypotension: No Has patient had a PCN reaction causing severe rash involving mucus membranes or skin necrosis: No Has patient had a PCN reaction that required hospitalization: No Has patient had a PCN reaction occurring within the last 10 years: No If all of the above answers are "NO", then may proceed with  Cephalosporin use. Patient passes out   Yellow Jacket Venom [bee Venom] Swelling        Medication List     TAKE these medications    acetaminophen 325 MG tablet Commonly known as: TYLENOL Take 2 tablets (650 mg total) by mouth every 6 (six) hours as needed for fever, moderate pain, mild pain or headache.   albuterol 108 (90 Base) MCG/ACT inhaler Commonly known as: VENTOLIN HFA Inhale 2 puffs into the lungs every 4 (four) hours as needed for wheezing or shortness of breath. What changed: Another medication with the same name was removed. Continue taking this medication, and follow the directions you see here.   albuterol 4 MG tablet Commonly known as: PROVENTIL Take 1 tablet by mouth 3 (three) times daily. What changed: Another medication with the same name was removed. Continue taking this medication, and follow the directions you see here.   apixaban 5 MG Tabs tablet Commonly known as: ELIQUIS 2 po BID  x 7 days, then 1 po BID Start taking on: November 07, 2021   Breztri Aerosphere 160-9-4.8 MCG/ACT Aero Generic drug: Budeson-Glycopyrrol-Formoterol Inhale 1 Dose into the lungs daily.   pantoprazole 40 MG tablet Commonly known as: PROTONIX Take 40 mg by mouth 2 (two) times daily.   rosuvastatin 10 MG tablet Commonly known as: CRESTOR Take 10 mg by mouth daily.        Procedures/Studies: ECHOCARDIOGRAM COMPLETE  Result Date: 10/30/2021    ECHOCARDIOGRAM REPORT   Patient Name:   ROBBERT LANGLINAIS Date of Exam: 10/30/2021 Medical Rec #:  [id number]         Height:       68.0 in Accession #:    [id number]        Weight:       130.1 lb Date of Birth:  September 06, 1955         BSA:          1.702 m Patient Age:    11 years          BP:           145/95 mmHg Patient Gender: M                 HR:           65 bpm. Exam Location:  [facility] Procedure: 2D Echo, Cardiac Doppler and Color Doppler Indications:    Pulmonary Embolus I26.09  History:        Patient has no prior history of  Echocardiogram examinations.                 CHF.  Sonographer:    Bernadene Person RDCS Referring Phys: [place]  1. Left ventricular ejection fraction, by estimation, is 50 to 55%. The left ventricle has low normal function. The left ventricle has no regional wall motion abnormalities. Left ventricular diastolic parameters are consistent with Grade I diastolic dysfunction (impaired relaxation).  2. Right ventricular systolic function is normal. The right ventricular size is normal. There is normal pulmonary artery systolic pressure. The estimated right ventricular systolic pressure is 53.2 mmHg.  3. The mitral valve is grossly normal. Trivial mitral valve regurgitation.  4. The aortic valve is tricuspid. Aortic valve regurgitation is not visualized.  5. The inferior vena cava is normal in size with greater than 50% respiratory variability, suggesting right atrial pressure of 3 mmHg. Comparison(s): No prior Echocardiogram. FINDINGS  Left Ventricle: Left ventricular ejection fraction, by estimation, is 50 to 55%. The left ventricle has low normal function. The left ventricle has no regional wall motion abnormalities. The left ventricular internal cavity size was normal in size. There is no left ventricular hypertrophy. Left ventricular diastolic parameters are consistent with Grade I diastolic dysfunction (impaired relaxation). Indeterminate filling pressures. Right Ventricle: The right ventricular size is normal. No increase in right ventricular wall thickness. Right ventricular systolic function is normal. There is normal pulmonary artery systolic pressure. The tricuspid regurgitant velocity is 2.23 m/s, and  with an assumed right atrial pressure of 3 mmHg, the estimated right ventricular systolic pressure is 99.2 mmHg. Left Atrium: Left atrial size was normal in size. Right Atrium: Right atrial size was normal in size. Pericardium: There is no evidence of pericardial effusion. Mitral  Valve: The mitral valve is grossly normal. Trivial mitral valve regurgitation. Tricuspid Valve: The tricuspid valve is grossly normal. Tricuspid valve regurgitation is trivial. Aortic Valve: The aortic valve is tricuspid. Aortic valve regurgitation is  not visualized. Pulmonic Valve: The pulmonic valve was normal in structure. Pulmonic valve regurgitation is not visualized. Aorta: The aortic root and ascending aorta are structurally normal, with no evidence of dilitation. Venous: The inferior vena cava is normal in size with greater than 50% respiratory variability, suggesting right atrial pressure of 3 mmHg. IAS/Shunts: No atrial level shunt detected by color flow Doppler.  LEFT VENTRICLE PLAX 2D LVIDd:         3.80 cm     Diastology LVIDs:         2.70 cm     LV e' medial:    8.68 cm/s LV PW:         0.90 cm     LV E/e' medial:  9.5 LV IVS:        0.70 cm     LV e' lateral:   7.42 cm/s LVOT diam:     2.10 cm     LV E/e' lateral: 11.1 LV SV:         53 LV SV Index:   31 LVOT Area:     3.46 cm  LV Volumes (MOD) LV vol d, MOD A2C: 75.1 ml LV vol d, MOD A4C: 83.7 ml LV vol s, MOD A2C: 37.0 ml LV vol s, MOD A4C: 43.0 ml LV SV MOD A2C:     38.1 ml LV SV MOD A4C:     83.7 ml LV SV MOD BP:      39.5 ml RIGHT VENTRICLE RV S prime:     10.30 cm/s TAPSE (M-mode): 1.7 cm LEFT ATRIUM             Index        RIGHT ATRIUM          Index LA diam:        2.30 cm 1.35 cm/m   RA Area:     9.49 cm LA Vol (A2C):   33.1 ml 19.45 ml/m  RA Volume:   14.90 ml 8.75 ml/m LA Vol (A4C):   25.5 ml 14.98 ml/m LA Biplane Vol: 31.4 ml 18.45 ml/m  AORTIC VALVE LVOT Vmax:   73.00 cm/s LVOT Vmean:  47.600 cm/s LVOT VTI:    0.154 m  AORTA Ao Root diam: 3.40 cm Ao Asc diam:  3.10 cm MITRAL VALVE               TRICUSPID VALVE MV Area (PHT): 3.85 cm    TR Peak grad:   19.9 mmHg MV Decel Time: 197 msec    TR Vmax:        223.00 cm/s MV E velocity: 82.70 cm/s MV A velocity: 72.40 cm/s  SHUNTS MV E/A ratio:  1.14        Systemic VTI:  0.15 m                             Systemic Diam: 2.10 cm Lyman Bishop MD Electronically signed by Lyman Bishop MD Signature Date/Time: 10/30/2021/4:43:45 PM    Final    US Venous Img Lower Bilateral (DVT)  Result Date: 10/30/2021 CLINICAL DATA:  Acute pulmonary embolism EXAM: Left LOWER EXTREMITY VENOUS DOPPLER ULTRASOUND TECHNIQUE: Gray-scale sonography with compression, as well as color and duplex ultrasound, were performed to evaluate the deep venous system(s) from the level of the common femoral vein through the popliteal and proximal calf veins. COMPARISON:  None Available. FINDINGS: VENOUS Normal compressibility of the common femoral, superficial femoral,  and popliteal veins, as well as the visualized calf veins. Visualized portions of profunda femoral vein and great saphenous vein unremarkable. No filling defects to suggest DVT on grayscale or color Doppler imaging. Doppler waveforms show normal direction of venous flow, normal respiratory plasticity and response to augmentation. Limited views of the contralateral common femoral vein are unremarkable. OTHER None. Limitations: none IMPRESSION: Negative. Electronically Signed   By: Franchot Gallo M.D.   On: 10/30/2021 13:43   NM Pulmonary Perfusion  Result Date: 10/30/2021 CLINICAL DATA:  Elevated D-dimer, chest pain for 2 days, COPD, question pulmonary embolism EXAM: NUCLEAR MEDICINE PERFUSION LUNG SCAN TECHNIQUE: Perfusion images were obtained in multiple projections after intravenous injection of radiopharmaceutical. Ventilation scans intentionally deferred if perfusion scan and chest x-ray adequate for interpretation during COVID 19 epidemic. RADIOPHARMACEUTICALS:  4.4 mCi Tc-49mMAA IV COMPARISON:  Chest radiograph 10/30/2021 FINDINGS: Diffusely diminished perfusion throughout the upper lobes bilaterally as well as portions of the RIGHT middle and RIGHT lower lobes. These areas of diminished perfusion are nonsegmental in appearance and consistent with the  severe emphysematous changes identified on chest radiographs. A single wedge-shaped perfusion defect is seen at the superior segment of the LEFT lower lobe. IMPRESSION: Diffusely abnormal perfusion throughout both lungs greatest in the upper lobes RIGHT greater than LEFT consistent with the severe emphysematous changes present on chest radiographs. Single large subsegmental defect superior segment LEFT lower lobe, concerning for pulmonary embolism. Findings discussed with Dr. KMatilde Sprangon 10/30/2021 at 1125 hours. Electronically Signed   By: MLavonia DanaM.D.   On: 10/30/2021 11:25   DG Chest 2 View  Result Date: 10/30/2021 CLINICAL DATA:  Chest pain EXAM: CHEST - 2 VIEW COMPARISON:  08/07/2021 FINDINGS: Heart size and pulmonary vascularity are normal. Emphysematous changes in the lungs. Scattered pulmonary fibrosis and bronchiectasis most prominent in the apices. No airspace disease or consolidation. No pleural effusions. No pneumothorax. Mediastinal contours appear intact. IMPRESSION: Emphysematous changes and fibrosis in the lungs. No focal consolidation. Electronically Signed   By: WLucienne CapersM.D.   On: 10/30/2021 02:18     Subjective: Pt reports that chest pain has resolved, no SOB, no bleeding complications, will follow up with PCP.   Discharge Exam: Vitals:   10/31/21 0456 10/31/21 0748  BP: 114/75   Pulse: 79   Resp: 15   Temp: 97.9 F (36.6 C)   SpO2: 97% 94%   Vitals:   10/31/21 0134 10/31/21 0320 10/31/21 0456 10/31/21 0748  BP: 104/83 94/81 114/75   Pulse: 83 85 79   Resp: '14 19 15   '$ Temp: 97.6 F (36.4 C) 97.9 F (36.6 C) 97.9 F (36.6 C)   TempSrc: Oral Oral Oral   SpO2: 93% 96% 97% 94%  Weight:      Height:       General: Pt is alert, awake, not in acute distress Cardiovascular: RRR, S1/S2 +, no rubs, no gallops Respiratory: CTA bilaterally, no wheezing, no rhonchi Abdominal: Soft, NT, ND, bowel sounds + Extremities: no edema, no cyanosis   The results of  significant diagnostics from this hospitalization (including imaging, microbiology, ancillary and laboratory) are listed below for reference.     Microbiology: No results found for this or any previous visit (from the past 240 hour(s)).   Labs: BNP (last 3 results) Recent Labs    08/07/21 0839  BNP 157.2  Basic Metabolic Panel: Recent Labs  Lab 10/30/21 0239 10/31/21 0509  NA 141 137  K 4.7 4.2  CL  103 107  CO2 27 23  GLUCOSE 108* 87  BUN 17 20  CREATININE 1.35* 1.17  CALCIUM 9.0 8.3*  MG  --  2.4   Liver Function Tests: No results for input(s): "AST", "ALT", "ALKPHOS", "BILITOT", "PROT", "ALBUMIN" in the last 168 hours. No results for input(s): "LIPASE", "AMYLASE" in the last 168 hours. No results for input(s): "AMMONIA" in the last 168 hours. CBC: Recent Labs  Lab 10/30/21 0239 10/31/21 0509  WBC 9.1 6.2  NEUTROABS 6.5  --   HGB 14.2 13.4  HCT 43.9 39.9  MCV 91.8 90.5  PLT 170 183   Cardiac Enzymes: No results for input(s): "CKTOTAL", "CKMB", "CKMBINDEX", "TROPONINI" in the last 168 hours. BNP: Invalid input(s): "POCBNP" CBG: No results for input(s): "GLUCAP" in the last 168 hours. D-Dimer Recent Labs    10/30/21 0239  DDIMER 0.93*   Hgb A1c No results for input(s): "HGBA1C" in the last 72 hours. Lipid Profile No results for input(s): "CHOL", "HDL", "LDLCALC", "TRIG", "CHOLHDL", "LDLDIRECT" in the last 72 hours. Thyroid function studies No results for input(s): "TSH", "T4TOTAL", "T3FREE", "THYROIDAB" in the last 72 hours.  Invalid input(s): "FREET3" Anemia work up No results for input(s): "VITAMINB12", "FOLATE", "FERRITIN", "TIBC", "IRON", "RETICCTPCT" in the last 72 hours. Urinalysis No results found for: "COLORURINE", "APPEARANCEUR", "LABSPEC", "PHURINE", "GLUCOSEU", "HGBUR", "BILIRUBINUR", "KETONESUR", "PROTEINUR", "UROBILINOGEN", "NITRITE", "LEUKOCYTESUR" Sepsis Labs Recent Labs  Lab 10/30/21 0239 10/31/21 0509  WBC 9.1 6.2    Microbiology No results found for this or any previous visit (from the past 240 hour(s)).  Time coordinating discharge: 34 mins   SIGNED:  Irwin Brakeman, MD  [facility] 10/31/2021, 11:26 AM How to contact the [facility] Attending or Consulting provider [place] or covering provider during after hours [place], for this patient?  Check the care team in [facility] and look for a) attending/consulting [facility] provider listed and b) the [facility] team listed Log into [email or web address] and use [place]'s universal password to access. If you do not have the password, please contact the hospital operator. Locate the [facility] provider you are looking for under [facility] and page to a number that you can be directly reached. If you still have difficulty reaching the provider, please page the [facility] (Director on Call) for the Hospitalists listed on amion for assistance.

## 2021-10-31 NOTE — Progress Notes (Signed)
ANTICOAGULATION CONSULT NOTE - Initial Consult  Pharmacy Consult for Heparin=> eliquis Indication: R/O pulmonary embolus  Allergies  Allergen Reactions   Penicillins     Has patient had a PCN reaction causing immediate rash, facial/tongue/throat swelling, SOB or lightheadedness with hypotension: No Has patient had a PCN reaction causing severe rash involving mucus membranes or skin necrosis: No Has patient had a PCN reaction that required hospitalization: No Has patient had a PCN reaction occurring within the last 10 years: No If all of the above answers are "NO", then may proceed with Cephalosporin use. Patient passes out   Yellow Jacket Venom [Bee Venom] Swelling    Patient Measurements: Height: '5\' 8"'$  (172.7 cm) Weight: 59 kg (130 lb 1.1 oz) IBW/kg (Calculated) : 68.4 Heparin Dosing Weight: 59  Vital Signs: Temp: 97.9 F (36.6 C) (10/12 0456) Temp Source: Oral (10/12 0456) BP: 114/75 (10/12 0456) Pulse Rate: 79 (10/12 0456)  Labs: Recent Labs    10/30/21 0239 10/30/21 0246 10/30/21 0414 10/30/21 2110 10/31/21 0509  HGB 14.2  --   --   --  13.4  HCT 43.9  --   --   --  39.9  PLT 170  --   --   --  183  HEPARINUNFRC  --   --   --  0.99* 0.75*  CREATININE 1.35*  --   --   --  1.17  TROPONINIHS  --  3 2  --   --      Estimated Creatinine Clearance: 51.8 mL/min (by C-G formula based on SCr of 1.17 mg/dL).  Assessment: 66 year old male with hx PE admitted with SOB and high suspicion for pulmonary embolism. Pharmacy consulted to assist with systemic anticoagulation. Baseline CBC wnl, some renal compromise noted. VQ scan + for PE, Venous US negative for DVT. Transitioning to eliquis  No issues and no s/sx bleeding per RN.   Goal of Therapy:  Heparin level 0.3-0.7 units/ml Monitor platelets by anticoagulation protocol: Yes   Plan:  D/C heparin Start eliquis '10mg'$  po bid x 7 days, then '5mg'$  po bid Continue to monitor H&H and platelets, s/sx bleeding  Educated on  eliquis  Isac Sarna, BS Pharm D, BCPS Clinical Pharmacist 10/31/2021 7:36 AM

## 2021-11-08 DIAGNOSIS — J449 Chronic obstructive pulmonary disease, unspecified: Secondary | ICD-10-CM | POA: Diagnosis not present

## 2021-11-08 DIAGNOSIS — T50905A Adverse effect of unspecified drugs, medicaments and biological substances, initial encounter: Secondary | ICD-10-CM | POA: Diagnosis not present

## 2021-11-08 DIAGNOSIS — I2699 Other pulmonary embolism without acute cor pulmonale: Secondary | ICD-10-CM | POA: Diagnosis not present

## 2021-11-08 DIAGNOSIS — K219 Gastro-esophageal reflux disease without esophagitis: Secondary | ICD-10-CM | POA: Diagnosis not present

## 2021-11-08 DIAGNOSIS — Z7901 Long term (current) use of anticoagulants: Secondary | ICD-10-CM | POA: Diagnosis not present

## 2021-11-08 DIAGNOSIS — Z681 Body mass index (BMI) 19 or less, adult: Secondary | ICD-10-CM | POA: Diagnosis not present

## 2021-11-15 DIAGNOSIS — Z7901 Long term (current) use of anticoagulants: Secondary | ICD-10-CM | POA: Diagnosis not present

## 2021-12-17 DIAGNOSIS — Z7901 Long term (current) use of anticoagulants: Secondary | ICD-10-CM | POA: Diagnosis not present

## 2022-03-11 DIAGNOSIS — I2699 Other pulmonary embolism without acute cor pulmonale: Secondary | ICD-10-CM | POA: Diagnosis not present

## 2022-03-11 DIAGNOSIS — Z6820 Body mass index (BMI) 20.0-20.9, adult: Secondary | ICD-10-CM | POA: Diagnosis not present

## 2022-03-11 DIAGNOSIS — K219 Gastro-esophageal reflux disease without esophagitis: Secondary | ICD-10-CM | POA: Diagnosis not present

## 2022-03-11 DIAGNOSIS — J449 Chronic obstructive pulmonary disease, unspecified: Secondary | ICD-10-CM | POA: Diagnosis not present

## 2022-03-11 DIAGNOSIS — Z7901 Long term (current) use of anticoagulants: Secondary | ICD-10-CM | POA: Diagnosis not present

## 2022-04-03 ENCOUNTER — Other Ambulatory Visit: Payer: Self-pay

## 2022-04-03 ENCOUNTER — Emergency Department (HOSPITAL_COMMUNITY)
Admission: EM | Admit: 2022-04-03 | Discharge: 2022-04-03 | Disposition: A | Discharge status: Home / Self Care | Payer: 59 | Attending: Emergency Medicine | Admitting: Emergency Medicine

## 2022-04-03 ENCOUNTER — Emergency Department (HOSPITAL_COMMUNITY): Payer: 59

## 2022-04-03 ENCOUNTER — Encounter (HOSPITAL_COMMUNITY): Payer: Self-pay

## 2022-04-03 DIAGNOSIS — I509 Heart failure, unspecified: Secondary | ICD-10-CM | POA: Insufficient documentation

## 2022-04-03 DIAGNOSIS — Z7901 Long term (current) use of anticoagulants: Secondary | ICD-10-CM | POA: Insufficient documentation

## 2022-04-03 DIAGNOSIS — R0602 Shortness of breath: Secondary | ICD-10-CM | POA: Diagnosis not present

## 2022-04-03 DIAGNOSIS — R9389 Abnormal findings on diagnostic imaging of other specified body structures: Secondary | ICD-10-CM | POA: Diagnosis not present

## 2022-04-03 DIAGNOSIS — E871 Hypo-osmolality and hyponatremia: Secondary | ICD-10-CM | POA: Insufficient documentation

## 2022-04-03 DIAGNOSIS — Z1152 Encounter for screening for COVID-19: Secondary | ICD-10-CM | POA: Insufficient documentation

## 2022-04-03 DIAGNOSIS — R7309 Other abnormal glucose: Secondary | ICD-10-CM | POA: Diagnosis not present

## 2022-04-03 DIAGNOSIS — Z86711 Personal history of pulmonary embolism: Secondary | ICD-10-CM | POA: Insufficient documentation

## 2022-04-03 DIAGNOSIS — J209 Acute bronchitis, unspecified: Secondary | ICD-10-CM | POA: Diagnosis not present

## 2022-04-03 DIAGNOSIS — R109 Unspecified abdominal pain: Secondary | ICD-10-CM | POA: Diagnosis not present

## 2022-04-03 DIAGNOSIS — N289 Disorder of kidney and ureter, unspecified: Secondary | ICD-10-CM | POA: Diagnosis not present

## 2022-04-03 DIAGNOSIS — R079 Chest pain, unspecified: Secondary | ICD-10-CM | POA: Diagnosis not present

## 2022-04-03 DIAGNOSIS — J439 Emphysema, unspecified: Secondary | ICD-10-CM | POA: Diagnosis not present

## 2022-04-03 DIAGNOSIS — R059 Cough, unspecified: Secondary | ICD-10-CM | POA: Diagnosis present

## 2022-04-03 LAB — RESP PANEL BY RT-PCR (RSV, FLU A&B, COVID)  RVPGX2
Influenza A by PCR: NEGATIVE
Influenza B by PCR: NEGATIVE
Resp Syncytial Virus by PCR: NEGATIVE
SARS Coronavirus 2 by RT PCR: NEGATIVE

## 2022-04-03 LAB — COMPREHENSIVE METABOLIC PANEL
ALT: 15 U/L (ref 0–44)
AST: 22 U/L (ref 15–41)
Albumin: 4 g/dL (ref 3.5–5.0)
Alkaline Phosphatase: 90 U/L (ref 38–126)
Anion gap: 14 (ref 5–15)
BUN: 22 mg/dL (ref 8–23)
CO2: 22 mmol/L (ref 22–32)
Calcium: 8.4 mg/dL — ABNORMAL LOW (ref 8.9–10.3)
Chloride: 96 mmol/L — ABNORMAL LOW (ref 98–111)
Creatinine, Ser: 1.44 mg/dL — ABNORMAL HIGH (ref 0.61–1.24)
GFR, Estimated: 53 mL/min — ABNORMAL LOW (ref >60)
Glucose, Bld: 117 mg/dL — ABNORMAL HIGH (ref 70–99)
Potassium: 3.9 mmol/L (ref 3.5–5.1)
Sodium: 132 mmol/L — ABNORMAL LOW (ref 135–145)
Total Bilirubin: 2.3 mg/dL — ABNORMAL HIGH (ref 0.3–1.2)
Total Protein: 8 g/dL (ref 6.5–8.1)

## 2022-04-03 LAB — CBC WITH DIFFERENTIAL/PLATELET
Abs Immature Granulocytes: 0.05 10*3/uL (ref 0.00–0.07)
Basophils Absolute: 0.1 10*3/uL (ref 0.0–0.1)
Basophils Relative: 1 %
Eosinophils Absolute: 0.1 10*3/uL (ref 0.0–0.5)
Eosinophils Relative: 1 %
HCT: 45 % (ref 39.0–52.0)
Hemoglobin: 14.9 g/dL (ref 13.0–17.0)
Immature Granulocytes: 0 %
Lymphocytes Relative: 5 %
Lymphs Abs: 0.7 10*3/uL (ref 0.7–4.0)
MCH: 29.3 pg (ref 26.0–34.0)
MCHC: 33.1 g/dL (ref 30.0–36.0)
MCV: 88.4 fL (ref 80.0–100.0)
Monocytes Absolute: 1.4 10*3/uL — ABNORMAL HIGH (ref 0.1–1.0)
Monocytes Relative: 11 %
Neutro Abs: 10.4 10*3/uL — ABNORMAL HIGH (ref 1.7–7.7)
Neutrophils Relative %: 82 %
Platelets: 161 10*3/uL (ref 150–400)
RBC: 5.09 MIL/uL (ref 4.22–5.81)
RDW: 14.3 % (ref 11.5–15.5)
WBC: 12.7 10*3/uL — ABNORMAL HIGH (ref 4.0–10.5)
nRBC: 0 % (ref 0.0–0.2)

## 2022-04-03 LAB — URINALYSIS, ROUTINE W REFLEX MICROSCOPIC
Bilirubin Urine: NEGATIVE
Glucose, UA: NEGATIVE mg/dL
Ketones, ur: 15 mg/dL — AB
Leukocytes,Ua: NEGATIVE
Nitrite: NEGATIVE
Specific Gravity, Urine: 1.01 (ref 1.005–1.030)
pH: 5.5 (ref 5.0–8.0)

## 2022-04-03 LAB — PROTIME-INR
INR: 2 — ABNORMAL HIGH (ref 0.8–1.2)
Prothrombin Time: 22.8 seconds — ABNORMAL HIGH (ref 11.4–15.2)

## 2022-04-03 LAB — URINALYSIS, MICROSCOPIC (REFLEX): Bacteria, UA: NONE SEEN

## 2022-04-03 LAB — LACTIC ACID, PLASMA
Lactic Acid, Venous: 1.1 mmol/L (ref 0.5–1.9)
Lactic Acid, Venous: 1.3 mmol/L (ref 0.5–1.9)

## 2022-04-03 MED ORDER — PREDNISONE 50 MG PO TABS: 50.0000 mg | ORAL_TABLET | Freq: Every day | ORAL | 0 refills | Status: DC

## 2022-04-03 MED ORDER — IOHEXOL 350 MG/ML SOLN
75.0000 mL | Freq: Once | INTRAVENOUS | Status: AC | PRN
  Administered 2022-04-03: 75 mL via INTRAVENOUS

## 2022-04-03 MED ORDER — IPRATROPIUM-ALBUTEROL 0.5-2.5 (3) MG/3ML IN SOLN
3.0000 mL | Freq: Once | RESPIRATORY_TRACT | Status: AC
  Administered 2022-04-03: 3 mL via RESPIRATORY_TRACT
  Filled 2022-04-03: qty 3

## 2022-04-03 MED ORDER — PREDNISONE 50 MG PO TABS
60.0000 mg | ORAL_TABLET | Freq: Once | ORAL | Status: AC
  Administered 2022-04-03: 60 mg via ORAL
  Filled 2022-04-03: qty 1

## 2022-04-03 MED ORDER — ALBUTEROL SULFATE (2.5 MG/3ML) 0.083% IN NEBU: 2.5000 mg | INHALATION_SOLUTION | Freq: Four times a day (QID) | RESPIRATORY_TRACT | 12 refills | Status: AC | PRN

## 2022-04-03 NOTE — ED Triage Notes (Signed)
Pt presents with right sided flank pain that started a few days ago and has gotten worse. Pt denies N/V. States that he has COPD and the pain has made his breathing worse.

## 2022-04-03 NOTE — ED Provider Notes (Signed)
[place] [facility] Provider Note   CSN: [id number] Arrival date & time: 04/03/22  [id number]     History  Chief Complaint  Patient presents with   Flank Pain    Robert Duarte is a 67 y.o. male.  The history is provided by the patient.  Flank Pain  He has history of emphysema, heart failure, pulmonary embolism anticoagulated on warfarin and comes in complaining of cough which is minimally productive of clear sputum.  Cough has been present for the last 3 days.  This has been associated with increase in his baseline dyspnea.  He has also noted pain in the right lower chest which is worse when he takes a deep breath or coughs.  Pain is similar to what he had with his pulmonary embolism.  He denies fever or chills but has had some sweats.  He has been compliant with his warfarin, but states that his warfarin dose has been increased after his last INR was checked about 3 weeks ago.   Home Medications Prior to Admission medications   Medication Sig Start Date End Date Taking? Authorizing Provider  acetaminophen (TYLENOL) 325 MG tablet Take 2 tablets (650 mg total) by mouth every 6 (six) hours as needed for fever, moderate pain, mild pain or headache. 10/31/21   Johnson, Clanford L, MD  albuterol (PROVENTIL HFA;VENTOLIN HFA) 108 (90 Base) MCG/ACT inhaler Inhale 2 puffs into the lungs every 4 (four) hours as needed for wheezing or shortness of breath. 03/06/18   Noemi Chapel, MD  albuterol (PROVENTIL) 4 MG tablet Take 1 tablet by mouth 3 (three) times daily. 10/28/21   [provider]  apixaban (ELIQUIS) 5 MG TABS tablet 2 po BID x 7 days, then 1 po BID 11/07/21   Johnson, Clanford L, MD  Budeson-Glycopyrrol-Formoterol (BREZTRI AEROSPHERE) 160-9-4.8 MCG/ACT AERO Inhale 1 Dose into the lungs daily.    [provider]  pantoprazole (PROTONIX) 40 MG tablet Take 40 mg by mouth 2 (two) times daily. 10/08/21   [provider]  rosuvastatin  (CRESTOR) 10 MG tablet Take 10 mg by mouth daily. 07/02/21   [provider]      Allergies    Penicillins and Yellow jacket venom [bee venom]    Review of Systems   Review of Systems  Genitourinary:  Positive for flank pain.  All other systems reviewed and are negative.   Physical Exam Updated Vital Signs BP (!) 157/87   Pulse (!) 127   Temp 98.3 F (36.8 C)   Resp 20   Wt 57.6 kg   SpO2 94%   BMI 19.31 kg/m  Physical Exam Vitals and nursing note reviewed.   67 year old male, resting comfortably and in no acute distress. Vital signs are significant for elevated blood pressure and heart rate. Oxygen saturation is 94%, which is normal. Head is normocephalic and atraumatic. PERRLA, EOMI. Oropharynx is clear. Neck is nontender and supple without adenopathy or JVD. Back is nontender and there is no CVA tenderness. Lungs have markedly diminished airflow. Chest is mildly tender in the right lower anterior rib cage.  There is no crepitus. Heart has regular rate and rhythm without murmur. Abdomen is soft, flat, nontender. Extremities have no cyanosis or edema, full range of motion is present. Skin is warm and dry without rash. Neurologic: Mental status is normal, cranial nerves are intact, moves all extremities equally.  ED Results / Procedures / Treatments   Labs (all labs ordered are  listed, but only abnormal results are displayed) Labs Reviewed  COMPREHENSIVE METABOLIC PANEL - Abnormal; Notable for the following components:      Result Value   Sodium 132 (*)    Chloride 96 (*)    Glucose, Bld 117 (*)    Creatinine, Ser 1.44 (*)    Calcium 8.4 (*)    Total Bilirubin 2.3 (*)    GFR, Estimated 53 (*)    All other components within normal limits  CBC WITH DIFFERENTIAL/PLATELET - Abnormal; Notable for the following components:   WBC 12.7 (*)    Neutro Abs 10.4 (*)    Monocytes Absolute 1.4 (*)    All other components within normal limits  PROTIME-INR - Abnormal;  Notable for the following components:   Prothrombin Time 22.8 (*)    INR 2.0 (*)    All other components within normal limits  CULTURE, BLOOD (ROUTINE X 2)  RESP PANEL BY RT-PCR (RSV, FLU A&B, COVID)  RVPGX2  CULTURE, BLOOD (ROUTINE X 2)  LACTIC ACID, PLASMA  LACTIC ACID, PLASMA  URINALYSIS, ROUTINE W REFLEX MICROSCOPIC    EKG EKG Interpretation  Date/Time:  Thursday April 03 2022 03:22:31 EDT Ventricular Rate:  121 PR Interval:  157 QRS Duration: 94 QT Interval:  315 QTC Calculation: 447 R Axis:   90 Text Interpretation: Sinus tachycardia Consider right atrial enlargement Borderline right axis deviation When compared with ECG of 10/30/2021, HEART RATE has increased Confirmed by Delora Fuel ([id number]) on 04/03/2022 3:54:14 AM  Radiology CT Angio Chest PE W and/or Wo Contrast  Result Date: 04/03/2022 CLINICAL DATA:  Pulmonary embolism suspected. EXAM: CT ANGIOGRAPHY CHEST WITH CONTRAST TECHNIQUE: Multidetector CT imaging of the chest was performed using the standard protocol during bolus administration of intravenous contrast. Multiplanar CT image reconstructions and MIPs were obtained to evaluate the vascular anatomy. RADIATION DOSE REDUCTION: This exam was performed according to the departmental dose-optimization program which includes automated exposure control, adjustment of the mA and/or kV according to patient size and/or use of iterative reconstruction technique. CONTRAST:  44m OMNIPAQUE IOHEXOL 350 MG/ML SOLN COMPARISON:  None Available. FINDINGS: Cardiovascular: Suboptimal but satisfactory opacification of the pulmonary arteries to the segmental level. No evidence of pulmonary embolism. Normal heart size. No pericardial effusion. Coronary atherosclerosis Mediastinum/Nodes: Negative for adenopathy or mass. Lungs/Pleura: Reticulation patch that band of opacity beneath the pleura at the right apex with band of inferior extending opacity and volume loss. No clear consolidation or  discrete mass. No edema, effusion, or pneumothorax. Panlobular emphysema. Upper Abdomen: No acute finding Musculoskeletal: No acute or aggressive finding. Review of the MIP images confirms the above findings. IMPRESSION: 1. Right upper lobe opacity primarily has features of scarring but there has been notable evolution since 10/30/2021 chest radiograph, recommend follow-up chest CT in 3 months to help differentiate postinflammatory and neoplastic disease. 2. No evidence of pulmonary embolism. 3. Aortic Atherosclerosis (ICD10-I70.0) and Emphysema (ICD10-J43.9). Electronically Signed   By: JJorje GuildM.D.   On: 04/03/2022 04:29   DG Chest Portable 1 View  Result Date: 04/03/2022 CLINICAL DATA:  Right-sided flank pain and shortness of breath. History of COPD and CHF. Possible sepsis. EXAM: PORTABLE CHEST 1 VIEW COMPARISON:  10/30/2021. FINDINGS: The heart size and mediastinal contours are within normal limits. There is atherosclerotic calcification of the aorta. Emphysematous changes are present in the lungs. Marked apical pleural thickening is noted on the right and new from the prior exam. A small nodular opacity is noted in the right upper lobe  measuring 1 cm. No effusion or pneumothorax. No acute osseous abnormality. IMPRESSION: 1. Marked apical pleural thickening on the right and nodular opacity in the right upper lobe, which are new from the previous exam. CT is recommended for further evaluation. 2. Emphysema. Electronically Signed   By: Brett Fairy M.D.   On: 04/03/2022 03:53    Procedures Procedures  Cardiac monitor shows sinus tachycardia, per my interpretation.  Medications Ordered in ED Medications  predniSONE (DELTASONE) tablet 60 mg (has no administration in time range)  ipratropium-albuterol (DUONEB) 0.5-2.5 (3) MG/3ML nebulizer solution 3 mL (3 mLs Nebulization Given 04/03/22 0401)  iohexol (OMNIPAQUE) 350 MG/ML injection 75 mL (75 mLs Intravenous Contrast Given 04/03/22 0409)     ED Course/ Medical Decision Making/ A&P                             Medical Decision Making Amount and/or Complexity of Data Reviewed Labs: ordered. Radiology: ordered.  Risk Prescription drug management.   Cough with chest pain and shortness of breath and patient with history of COPD and history of pulmonary embolism.  Considered pneumonia, bronchitis, recurrent pulmonary embolism.  Bronchitis could be bacterial or viral.  Consider influenza, COVID-19, RSV, other viral infections.  I have reviewed and interpreted his electrocardiogram and my interpretation is sinus tachycardia and otherwise unremarkable ECG.  I have ordered a nebulizer treatment with albuterol and ipratropium.  I have ordered laboratory testing of CBC, basic metabolic panel, INR, respiratory pathogen panel.  I have also ordered CT angiogram of the chest to rule out recurrent pulmonary embolism as well as evaluate for occult pneumonia.  I have reviewed his past records, and he was admitted on 10/30/2021 with diagnosis of pulmonary embolism.  He was discharged from the hospital on apixaban.  Chest x-ray shows right upper lobe opacity with recommendation of obtaining CT scan.  CT angiogram shows scarring which has progressed compared with October 2023 with concern for possible malignancy and recommendation for repeat CT scan in 3 months.  No evidence of pneumonia or pulmonary embolism.  I have reviewed his laboratory tests, and respiratory pathogen panel is negative for influenza, COVID-19, RSV.  Following nebulizer treatment, patient feels that he is back to his baseline.  He has a home nebulizer but is out of the nebulizer solution.  I am ordering a dose of prednisone and I am discharging him on a 5-day course of prednisone, and giving him a prescription for albuterol solution for nebulizer.  I am recommending that he get a repeat CT of his chest in 3 months.  Final Clinical Impression(s) / ED Diagnoses Final diagnoses:   Acute bronchitis, unspecified organism  Hyponatremia  Renal insufficiency  Elevated random blood glucose level  Abnormal CT of the chest    Rx / DC Orders ED Discharge Orders          Ordered    albuterol (PROVENTIL) (2.5 MG/3ML) 0.083% nebulizer solution  Every 6 hours PRN        04/03/22 0530    predniSONE (DELTASONE) 50 MG tablet  Daily        04/03/22 [id number]              Delora Fuel, MD [id number] [phone]

## 2022-04-03 NOTE — Discharge Instructions (Signed)
Your CT scan showed some scarring that could actually represent a tumor.  Radiologist recommends repeat CT scan without contrast in 3 months.  Your primary care provider can order this.

## 2022-04-08 LAB — CULTURE, BLOOD (ROUTINE X 2)
Culture: NO GROWTH
Culture: NO GROWTH
Special Requests: ADEQUATE

## 2022-04-23 DIAGNOSIS — J449 Chronic obstructive pulmonary disease, unspecified: Secondary | ICD-10-CM | POA: Diagnosis not present

## 2022-04-23 DIAGNOSIS — R9389 Abnormal findings on diagnostic imaging of other specified body structures: Secondary | ICD-10-CM | POA: Diagnosis not present

## 2022-04-23 DIAGNOSIS — Z681 Body mass index (BMI) 19 or less, adult: Secondary | ICD-10-CM | POA: Diagnosis not present

## 2022-04-23 DIAGNOSIS — K219 Gastro-esophageal reflux disease without esophagitis: Secondary | ICD-10-CM | POA: Diagnosis not present

## 2022-04-23 DIAGNOSIS — Z7901 Long term (current) use of anticoagulants: Secondary | ICD-10-CM | POA: Diagnosis not present

## 2022-04-23 DIAGNOSIS — R091 Pleurisy: Secondary | ICD-10-CM | POA: Diagnosis not present

## 2022-05-14 NOTE — Progress Notes (Signed)
Robert Duarte, male    DOB: 1955-03-26    MRN: [id number]   Brief patient profile:  75  yowm quit smoking 2020  referred to pulmonary clinic in [place]  05/15/2022 by Dr Sherwood Gambler  for copd eval s/p aecopd  > ER  04/03/22    History of Present Illness  05/15/2022  Pulmonary/ 1st office eval/ Kaushik Maul / [place]  Office  maint on albuterol /ppi bid ac / breztri  Chief Complaint  Patient presents with   Consult    Pt consult for COPD states that he was dx in 2020 - quit smoking. He reports he uses albuterol very frequently.   Dyspnea:  MMRC2 = can't walk a nl pace on a flat grade s sob but does fine slow and flat  Cough: esp at hs if hot > min mucoid Sleep: bed is level with 2 pillow  SABA use: avg bid hfa/ tid  02: none  Usually pred feels "like he can walk block 3 "x but only does once even on best days  No obvious  other day to day or daytime patterns/variability or assoc excess/ purulent sputum or mucus plugs or hemoptysis or cp or chest tightness, subjective wheeze or overt sinus or hb symptoms.   Also denies any obvious fluctuation of symptoms with weather or environmental changes or other aggravating or alleviating factors except as outlined above   No unusual exposure hx or h/o childhood pna/ asthma or knowledge of premature birth.  Current Allergies, Complete Past Medical History, Past Surgical History, Family History, and Social History were reviewed in [employer] record.  ROS  The following are not active complaints unless bolded Hoarseness, sore throat, dysphagia, dental problems, itching, sneezing,  nasal congestion or discharge of excess mucus or purulent secretions, ear ache,   fever, chills, sweats, unintended wt loss or wt gain, classically pleuritic or exertional cp,  orthopnea pnd or arm/hand swelling  or leg swelling, presyncope, palpitations, abdominal pain, anorexia, nausea, vomiting, diarrhea  or change in bowel habits or change in bladder  habits, change in stools or change in urine, dysuria, hematuria,  rash, arthralgias, visual complaints, headache, numbness, weakness or ataxia or problems with walking or coordination,  change in mood or  memory.              Past Medical History:  Diagnosis Date   Arthritis    CHF (congestive heart failure)    Emphysema     Outpatient Medications Prior to Visit  Medication Sig Dispense Refill   acetaminophen (TYLENOL) 325 MG tablet Take 2 tablets (650 mg total) by mouth every 6 (six) hours as needed for fever, moderate pain, mild pain or headache.     albuterol (PROVENTIL HFA;VENTOLIN HFA) 108 (90 Base) MCG/ACT inhaler Inhale 2 puffs into the lungs every 4 (four) hours as needed for wheezing or shortness of breath. 1 Inhaler 3   albuterol (PROVENTIL) (2.5 MG/3ML) 0.083% nebulizer solution Take 3 mLs (2.5 mg total) by nebulization every 6 (six) hours as needed for wheezing or shortness of breath. 75 mL 12   albuterol (PROVENTIL) 4 MG tablet Take 1 tablet by mouth 3 (three) times daily.     Budeson-Glycopyrrol-Formoterol (BREZTRI AEROSPHERE) 160-9-4.8 MCG/ACT AERO Inhale 1 Dose into the lungs daily.     pantoprazole (PROTONIX) 40 MG tablet Take 40 mg by mouth 2 (two) times daily.     rosuvastatin (CRESTOR) 10 MG tablet Take 10 mg by mouth daily.  warfarin (COUMADIN) 5 MG tablet Take 5 mg by mouth daily.     apixaban (ELIQUIS) 5 MG TABS tablet 2 po BID x 7 days, then 1 po BID 60 tablet 2   predniSONE (DELTASONE) 50 MG tablet Take 1 tablet (50 mg total) by mouth daily. (Patient not taking: Reported on 05/15/2022) 5 tablet 0   No facility-administered medications prior to visit.     Objective:     BP 113/75   Pulse 90   Ht  (1.727 m)   Wt 122 lb 12.8 oz (55.7 kg)   SpO2 95% Comment: RA  BMI 18.67 kg/m   SpO2: 95 % (RA)  Amb pleasant thin wm nad  HEENT :  Oropharynx  clear/ 7 bottom teeth remaining but in very poor shape  Nasal turbinates nl    NECK :  without  JVD/Nodes/TM/ nl carotid upstrokes bilaterally   LUNGS: no acc muscle use,  Mod barrel  contour chest wall with bilateral  Distant exp  wheeze and  without cough on insp or exp maneuvers and mod  Hyperresonant  to  percussion bilaterally     CV:  RRR  no s3 or murmur or increase in P2, and no edema   ABD:  soft and nontender with pos mid insp Hoover's  in the supine position. No bruits or organomegaly appreciated, bowel sounds nl  MS:   Ext warm without deformities or   obvious joint restrictions , calf tenderness, cyanosis or clubbing  SKIN: warm and dry without lesions    NEURO:  alert, approp, nl sensorium with  no motor or cerebellar deficits apparent.              I personally reviewed images and agree with radiology impression as follows:   Chest CTa    04/03/22  1. Right upper lobe opacity primarily has features of scarring but there has been notable evolution since 10/30/2021 chest radiograph, recommend follow-up chest CT in 3 months to help differentiate postinflammatory and neoplastic disease. 2. No evidence of pulmonary embolism. 3. Aortic Atherosclerosis (ICD10-I70.0) and Emphysema (ICD10-J43.9).   Assessment   COPD GOLD ? / AB Quit smoking 2020 - 05/15/2022  After extensive coaching inhaler device,  effectiveness =  75% from a baselin of maybe 25% so try breztri and approp saba p pred x 12 day taper  - 05/15/2022   Walked on RA  x  3  lap(s) =  approx 450  ft  @ brisk pace, stopped due to end of study  with lowest 02 sats 92%  with mild sob     Group D (now reclassified as E) in terms of symptom/risk and laba/lama/ICS  therefore appropriate rx at this point >>>  Breztri 2bid and more approp saba - way over using now and at risk of tachyphylaxis so use 12 d of prednisone to reset the B 2 receptors  Re SABA :  I spent extra time with pt today reviewing appropriate use of albuterol for prn use on exertion with the following points: 1) saba is for relief of sob that does  not improve by walking a slower pace or resting but rather if the pt does not improve after trying this first. 2) If the pt is convinced, as many are, that saba helps recover from activity faster then it's easy to tell if this is the case by re-challenging : ie stop, take the inhaler, then p 5 minutes try the exact same activity (intensity of workload)  that just caused the symptoms and see if they are substantially diminished or not after saba 3) if there is an activity that reproducibly causes the symptoms, try the saba 15 min before the activity on alternate days   If in fact the saba really does help, then fine to continue to use it prn but advised may need to look closer at the maintenance regimen being used to achieve better control of airways disease with exertion.   Will need alpha one at screen at f/u  in 6 weeks   Each maintenance medication was reviewed in detail including emphasizing most importantly the difference between maintenance and prns and under what circumstances the prns are to be triggered using an action plan format where appropriate.  Total time for H and P, chart review, counseling, reviewing hfa device(s) , directly observing portions of ambulatory 02 saturation study/ and generating customized AVS unique to this office visit / same day charting  > 45 min new pt eval                   Sandrea Hughs, MD 05/15/2022

## 2022-05-15 ENCOUNTER — Encounter: Payer: Self-pay | Admitting: Internal Medicine

## 2022-05-15 ENCOUNTER — Ambulatory Visit (INDEPENDENT_AMBULATORY_CARE_PROVIDER_SITE_OTHER): Payer: 59 | Admitting: Internal Medicine

## 2022-05-15 VITALS — BP 113/75 | HR 90 | Ht 68.0 in | Wt 122.8 lb

## 2022-05-15 DIAGNOSIS — J449 Chronic obstructive pulmonary disease, unspecified: Secondary | ICD-10-CM

## 2022-05-15 MED ORDER — PREDNISONE 10 MG PO TABS: ORAL_TABLET | ORAL | 0 refills | Status: DC

## 2022-05-15 MED ORDER — BREZTRI AEROSPHERE 160-9-4.8 MCG/ACT IN AERO: 2.0000 | INHALATION_SPRAY | Freq: Two times a day (BID) | RESPIRATORY_TRACT | 0 refills | Status: DC

## 2022-05-15 NOTE — Assessment & Plan Note (Addendum)
Quit smoking 2020 - 05/15/2022  After extensive coaching inhaler device,  effectiveness =  75% from a baselin of maybe 25% so try breztri and approp saba p pred x 12 day taper  - 05/15/2022   Walked on RA  x  3  lap(s) =  approx 450  ft  @ brisk pace, stopped due to end of study  with lowest 02 sats 92%  with mild sob     Group D (now reclassified as E) in terms of symptom/risk and laba/lama/ICS  therefore appropriate rx at this point >>>  Breztri 2bid and more approp saba - way over using now and at risk of tachyphylaxis so use 12 d of prednisone to reset the B 2 receptors  Re SABA :  I spent extra time with pt today reviewing appropriate use of albuterol for prn use on exertion with the following points: 1) saba is for relief of sob that does not improve by walking a slower pace or resting but rather if the pt does not improve after trying this first. 2) If the pt is convinced, as many are, that saba helps recover from activity faster then it's easy to tell if this is the case by re-challenging : ie stop, take the inhaler, then p 5 minutes try the exact same activity (intensity of workload) that just caused the symptoms and see if they are substantially diminished or not after saba 3) if there is an activity that reproducibly causes the symptoms, try the saba 15 min before the activity on alternate days   If in fact the saba really does help, then fine to continue to use it prn but advised may need to look closer at the maintenance regimen being used to achieve better control of airways disease with exertion.   Will need alpha one at screen at f/u  in 6 weeks   Each maintenance medication was reviewed in detail including emphasizing most importantly the difference between maintenance and prns and under what circumstances the prns are to be triggered using an action plan format where appropriate.  Total time for H and P, chart review, counseling, reviewing hfa device(s) , directly observing portions  of ambulatory 02 saturation study/ and generating customized AVS unique to this office visit / same day charting  > 45 min new pt eval

## 2022-05-15 NOTE — Patient Instructions (Addendum)
Plan A = Automatic = Always=    Breztri Take 2 puffs first thing in am and then another 2 puffs about 12 hours later.    Work on inhaler technique:  relax and gently blow all the way out then take a nice smooth full deep breath back in, triggering the inhaler at same time you start breathing in.  Hold breath in for at least  5 seconds if you can. Blow out breztri  thru nose. Rinse and gargle with water when done.  If mouth or throat bother you at all,  try brushing teeth/gums/tongue with arm and hammer toothpaste/ make a slurry and gargle and spit out.  - remember how golfers take practice swings and use the empty device to warm up  Plan B = Backup (to supplement plan A, not to replace it) Only use your albuterol inhaler as a rescue medication to be used if you can't catch your breath by resting or doing a relaxed purse lip breathing pattern.  - The less you use it, the better it will work when you need it. - Ok to use the inhaler up to 2 puffs  every 4 hours if you must but call for appointment if use goes up over your usual need - Don't leave home without it !!  (think of it like the spare tire for your car)   Plan C = Crisis (instead of Plan B but only if Plan B stops working) - only use your albuterol nebulizer if you first try Plan B and it fails to help > ok to use the nebulizer up to every 4 hours but if start needing it regularly call for immediate appointment  Also  Ok to try albuterol 15 min before an activity (on alternating days)  that you know would usually make you short of breath and see if it makes any difference and if makes none then don't take albuterol after activity unless you can't catch your breath as this means it's the resting that helps, not the albuterol.      Prednisone Take 4 for three days,  3 for three days, 2 for three days,  1 for three days and stop   After 5 days of prednisone please stop the albuterol pills    Please schedule a follow up office visit in 6  weeks, call sooner if needed   Needs alpha one AT phenotype on return.

## 2022-05-21 DIAGNOSIS — Z7901 Long term (current) use of anticoagulants: Secondary | ICD-10-CM | POA: Diagnosis not present

## 2022-06-23 ENCOUNTER — Emergency Department (HOSPITAL_COMMUNITY)
Admission: EM | Admit: 2022-06-23 | Discharge: 2022-06-23 | Disposition: A | Discharge status: Home / Self Care | Payer: 59 | Attending: Emergency Medicine | Admitting: Emergency Medicine

## 2022-06-23 ENCOUNTER — Emergency Department (HOSPITAL_COMMUNITY): Payer: 59

## 2022-06-23 ENCOUNTER — Other Ambulatory Visit: Payer: Self-pay

## 2022-06-23 ENCOUNTER — Encounter (HOSPITAL_COMMUNITY): Payer: Self-pay | Admitting: Emergency Medicine

## 2022-06-23 DIAGNOSIS — Z7901 Long term (current) use of anticoagulants: Secondary | ICD-10-CM | POA: Diagnosis not present

## 2022-06-23 DIAGNOSIS — J449 Chronic obstructive pulmonary disease, unspecified: Secondary | ICD-10-CM | POA: Insufficient documentation

## 2022-06-23 DIAGNOSIS — I509 Heart failure, unspecified: Secondary | ICD-10-CM | POA: Diagnosis not present

## 2022-06-23 DIAGNOSIS — R079 Chest pain, unspecified: Secondary | ICD-10-CM | POA: Diagnosis not present

## 2022-06-23 DIAGNOSIS — J439 Emphysema, unspecified: Secondary | ICD-10-CM | POA: Diagnosis not present

## 2022-06-23 DIAGNOSIS — R0789 Other chest pain: Secondary | ICD-10-CM | POA: Diagnosis not present

## 2022-06-23 DIAGNOSIS — I2699 Other pulmonary embolism without acute cor pulmonale: Secondary | ICD-10-CM | POA: Diagnosis not present

## 2022-06-23 LAB — BASIC METABOLIC PANEL
Anion gap: 9 (ref 5–15)
BUN: 17 mg/dL (ref 8–23)
CO2: 27 mmol/L (ref 22–32)
Calcium: 8.6 mg/dL — ABNORMAL LOW (ref 8.9–10.3)
Chloride: 103 mmol/L (ref 98–111)
Creatinine, Ser: 1.53 mg/dL — ABNORMAL HIGH (ref 0.61–1.24)
GFR, Estimated: 50 mL/min — ABNORMAL LOW (ref >60)
Glucose, Bld: 86 mg/dL (ref 70–99)
Potassium: 3.5 mmol/L (ref 3.5–5.1)
Sodium: 139 mmol/L (ref 135–145)

## 2022-06-23 LAB — PROTIME-INR
INR: 4.2 (ref 0.8–1.2)
Prothrombin Time: 40.6 seconds — ABNORMAL HIGH (ref 11.4–15.2)

## 2022-06-23 LAB — CBC
HCT: 41.4 % (ref 39.0–52.0)
Hemoglobin: 13.7 g/dL (ref 13.0–17.0)
MCH: 29.5 pg (ref 26.0–34.0)
MCHC: 33.1 g/dL (ref 30.0–36.0)
MCV: 89.2 fL (ref 80.0–100.0)
Platelets: 220 10*3/uL (ref 150–400)
RBC: 4.64 MIL/uL (ref 4.22–5.81)
RDW: 14.6 % (ref 11.5–15.5)
WBC: 5.1 10*3/uL (ref 4.0–10.5)
nRBC: 0 % (ref 0.0–0.2)

## 2022-06-23 LAB — TROPONIN I (HIGH SENSITIVITY): Troponin I (High Sensitivity): 7 ng/L (ref <18)

## 2022-06-23 NOTE — Discharge Instructions (Addendum)
Your INR was 4.2 today.  Talk to the people that are dosing it to see if they want you to change your regimen.

## 2022-06-23 NOTE — ED Triage Notes (Addendum)
Pt reports left chest pain, nonradiating, intermittent x a week and suddenly worse this evening. Rates pain 10/10 intermittent sharp pain. Denies n/v/dizziness, no SOB, no diaphoresis. Pt has hx COPD and PE on warfarin.

## 2022-06-23 NOTE — ED Provider Notes (Signed)
[place] EMERGENCY DEPARTMENT AT [facility] Provider Note   CSN: [id number] Arrival date & time: 06/23/22  1931     History  Chief Complaint  Patient presents with   Chest Pain    Robert Duarte is a 67 y.o. male.   Chest Pain Patient presents with left-sided chest pain.  Has had on and off for a week but worse today.  States it feels sharp in his anterior chest and will come and go.  Will spasm up.  No numbness or weakness.  Not exertional.  Previous history of pulmonary embolus symptoms.  No fevers.  No cough.  Also COPD history.    Past Medical History:  Diagnosis Date   Arthritis    CHF (congestive heart failure) (HCC)    Emphysema     Home Medications Prior to Admission medications   Medication Sig Start Date End Date Taking? Authorizing Provider  acetaminophen (TYLENOL) 325 MG tablet Take 2 tablets (650 mg total) by mouth every 6 (six) hours as needed for fever, moderate pain, mild pain or headache. 10/31/21   Johnson, Clanford L, MD  albuterol (PROVENTIL HFA;VENTOLIN HFA) 108 (90 Base) MCG/ACT inhaler Inhale 2 puffs into the lungs every 4 (four) hours as needed for wheezing or shortness of breath. 03/06/18   Eber Hong, MD  albuterol (PROVENTIL) (2.5 MG/3ML) 0.083% nebulizer solution Take 3 mLs (2.5 mg total) by nebulization every 6 (six) hours as needed for wheezing or shortness of breath. 04/03/22   Dione Booze, MD  albuterol (PROVENTIL) 4 MG tablet Take 1 tablet by mouth 3 (three) times daily. 10/28/21   [provider]  Budeson-Glycopyrrol-Formoterol (BREZTRI AEROSPHERE) 160-9-4.8 MCG/ACT AERO Inhale 1 Dose into the lungs daily.    [provider]  Budeson-Glycopyrrol-Formoterol (BREZTRI AEROSPHERE) 160-9-4.8 MCG/ACT AERO Inhale 2 puffs into the lungs in the morning and at bedtime. 05/15/22   Nyoka Cowden, MD  pantoprazole (PROTONIX) 40 MG tablet Take 40 mg by mouth 2 (two) times daily. 10/08/21   [provider]   predniSONE (DELTASONE) 10 MG tablet Take  4 each am x3 days, 3 x 3days,   2 each am x 3 days,  1 each am x 3 days and stop 05/15/22   Nyoka Cowden, MD  rosuvastatin (CRESTOR) 10 MG tablet Take 10 mg by mouth daily. 07/02/21   [provider]  warfarin (COUMADIN) 5 MG tablet Take 5 mg by mouth daily. 04/28/22   [provider]      Allergies    Penicillins and Yellow jacket venom [bee venom]    Review of Systems   Review of Systems  Cardiovascular:  Positive for chest pain.    Physical Exam Updated Vital Signs BP (!) 134/90 (BP Location: Right Arm)   Pulse 79   Temp 98.2 F (36.8 C) (Oral)   Resp 14   Ht 5\' 8"  (1.727 m)   Wt 55.3 kg   SpO2 99%   BMI 18.55 kg/m  Physical Exam Vitals and nursing note reviewed.  Cardiovascular:     Rate and Rhythm: Regular rhythm.  Pulmonary:     Comments: Tenderness to left anterior chest wall. Chest:     Chest wall: Tenderness present.  Abdominal:     Tenderness: There is no abdominal tenderness.  Musculoskeletal:     Right lower leg: No tenderness. No edema.     Left lower leg: No tenderness. No edema.  Skin:    General: Skin is warm.  Neurological:  Mental Status: He is alert.     ED Results / Procedures / Treatments   Labs (all labs ordered are listed, but only abnormal results are displayed) Labs Reviewed  BASIC METABOLIC PANEL - Abnormal; Notable for the following components:      Result Value   Creatinine, Ser 1.53 (*)    Calcium 8.6 (*)    GFR, Estimated 50 (*)    All other components within normal limits  PROTIME-INR - Abnormal; Notable for the following components:   Prothrombin Time 40.6 (*)    INR 4.2 (*)    All other components within normal limits  CBC  TROPONIN I (HIGH SENSITIVITY)    EKG EKG Interpretation  Date/Time:  Monday June 23 2022 19:48:16 EDT Ventricular Rate:  82 PR Interval:  165 QRS Duration: 94 QT Interval:  390 QTC Calculation: 456 R Axis:   80 Text  Interpretation: Sinus rhythm Right atrial enlargement Confirmed by Benjiman Core [phone]) on 06/23/2022 8:43:52 PM  Radiology DG Chest 2 View  Result Date: 06/23/2022 CLINICAL DATA:  Left-sided chest pain starting this evening. History of COPD and CHF. EXAM: CHEST - 2 VIEW COMPARISON:  Chest radiographs 04/03/2022 and 10/30/2021; CT chest 04/03/2022 FINDINGS: Cardiac silhouette and mediastinal contours are within normal limits. There are linear intra-articular opacities again seen within the right lung apex that are unchanged from 04/03/2022 radiograph and CT but again new from 10/30/2021 radiograph. There is moderate to high-grade hyperinflation. Moderate lateral cystic changes consistent with emphysema. No pleural effusion pneumothorax. Mild multilevel degenerative disc changes of the thoracic spine. IMPRESSION: 1. Right apical opacities again appear to represent scarring, and are unchanged from 04/03/2022 CT and radiograph. However, again these are markedly increased from 10/30/2021 radiograph. The 04/03/2022 CT did recommend follow-up CT in 3 months to assess for postinflammatory scarring versus neoplastic disease for which the patient would be due around 07/04/2022. 2. Moderate to high-grade emphysema. 3. No new airspace opacity to indicate pneumonia compared to most recent 04/03/2022 radiographs. Electronically Signed   By: Neita Garnet M.D.   On: 06/23/2022 20:37    Procedures Procedures    Medications Ordered in ED Medications - No data to display  ED Course/ Medical Decision Making/ A&P                             Medical Decision Making Amount and/or Complexity of Data Reviewed Labs: ordered. Radiology: ordered.   Patient with left-sided chest pain.  Sharp.  Differential diagnosis include musculoskeletal pain, cardiac disease, pneumonia, pulmonary embolism. X-ray reassuring.  Shows persistent changes from prior.  Doubt pneumonia.  Doubt pulm embolism in fact INR is 4.2.  Doubt  aortic dissection.  Potentially musculoskeletal pain.  Appears stable for discharge home.  Will discuss with the people dosing his Coumadin to see if they want to adjust.  However will discharge.        Final Clinical Impression(s) / ED Diagnoses Final diagnoses:  Nonspecific chest pain    Rx / DC Orders ED Discharge Orders     None         Benjiman Core, MD 06/23/22 2138

## 2022-06-27 ENCOUNTER — Telehealth: Payer: Self-pay

## 2022-06-27 NOTE — Telephone Encounter (Signed)
Transition Care Management Unsuccessful Follow-up Telephone Call  Date of discharge and from where:  [facility] 6/[facility]  Attempts:  2nd Attempt  Reason for unsuccessful TCM follow-up call:  Left voice message   Lenard Forth [facility] Guide, [facility] [phone] [address] [place], [place], [place] [address] Phone: [phone] Email: [email or web address]

## 2022-06-27 NOTE — Telephone Encounter (Signed)
Transition Care Management Unsuccessful Follow-up Telephone Call  Date of discharge and from where:  [facility] 6/3  Attempts:  1st Attempt  Reason for unsuccessful TCM follow-up call:  Left voice message   Khale[facility] Guide, [place] [phone] [address], [place], [place] [address] Phone: [phone] Email: [email or web address]       

## 2022-07-01 DIAGNOSIS — Z681 Body mass index (BMI) 19 or less, adult: Secondary | ICD-10-CM | POA: Diagnosis not present

## 2022-07-01 DIAGNOSIS — Z7901 Long term (current) use of anticoagulants: Secondary | ICD-10-CM | POA: Diagnosis not present

## 2022-07-01 DIAGNOSIS — Z0001 Encounter for general adult medical examination with abnormal findings: Secondary | ICD-10-CM | POA: Diagnosis not present

## 2022-07-01 DIAGNOSIS — I2699 Other pulmonary embolism without acute cor pulmonale: Secondary | ICD-10-CM | POA: Diagnosis not present

## 2022-07-01 DIAGNOSIS — Z9229 Personal history of other drug therapy: Secondary | ICD-10-CM | POA: Diagnosis not present

## 2022-07-01 DIAGNOSIS — K219 Gastro-esophageal reflux disease without esophagitis: Secondary | ICD-10-CM | POA: Diagnosis not present

## 2022-07-01 DIAGNOSIS — J449 Chronic obstructive pulmonary disease, unspecified: Secondary | ICD-10-CM | POA: Diagnosis not present

## 2022-07-01 DIAGNOSIS — D696 Thrombocytopenia, unspecified: Secondary | ICD-10-CM | POA: Diagnosis not present

## 2022-07-01 DIAGNOSIS — R9389 Abnormal findings on diagnostic imaging of other specified body structures: Secondary | ICD-10-CM | POA: Diagnosis not present

## 2022-07-07 ENCOUNTER — Ambulatory Visit (INDEPENDENT_AMBULATORY_CARE_PROVIDER_SITE_OTHER): Payer: 59 | Admitting: Internal Medicine

## 2022-07-07 ENCOUNTER — Encounter: Payer: Self-pay | Admitting: Internal Medicine

## 2022-07-07 VITALS — BP 124/77 | HR 77 | Ht 68.0 in | Wt 124.0 lb

## 2022-07-07 DIAGNOSIS — J449 Chronic obstructive pulmonary disease, unspecified: Secondary | ICD-10-CM | POA: Diagnosis not present

## 2022-07-07 NOTE — Progress Notes (Unsigned)
Robert Duarte, male    DOB: 02/08/1955    MRN: [id number]   Brief patient profile:  80  yowm quit smoking 2020  referred to pulmonary clinic in [place]  05/15/2022 by Dr Robert Duarte  for copd eval s/p aecopd  > ER  04/03/22    History of Present Illness  05/15/2022  Pulmonary/ 1st office eval/ Robert Duarte / [place]  Office  maint on albuterol /ppi bid ac / breztri  Chief Complaint  Patient presents with   Consult    Pt consult for COPD states that he was dx in 2020 - quit smoking. He reports he uses albuterol very frequently.   Dyspnea:  MMRC2 = can't walk a nl pace on a flat grade s sob but does fine slow and flat  Cough: esp at hs if hot > min mucoid Sleep: bed is level with 2 pillow  SABA use: avg bid hfa/ tid  02: none  Usually pred feels "like he can walk block 3 "x but only does once even on best days Rec Plan A = Automatic = Always=    Breztri Take 2 puffs first thing in am and then another 2 puffs about 12 hours later.  Work on inhaler technique:   Plan B = Backup (to supplement plan A, not to replace it) Only use your albuterol inhaler as a rescue medication Plan C = Crisis (instead of Plan B but only if Plan B stops working) - only use your albuterol nebulizer if you first try Plan B  Also  Ok to try albuterol 15 min before an activity (on alternating days)  that you know would usually make you short of breath   Prednisone Take 4 for three days,  3 for three days, 2 for three days,  1 for three days and stop   After 5 days of prednisone please stop the albuterol pills          07/07/2022  f/u ov/[place] office/Robert Duarte re: doe  maint on breztri    Chief Complaint  Patient presents with   Follow-up    Pt f/u states that he is doing well. No current questions or concerns.    Dyspnea:  walking 20 min some hills Cough: none  Sleeping: flat bed x 2 pillows fine  SABA use: once or twice daily  02: none      No obvious day to day or daytime variability or assoc  excess/ purulent sputum or mucus plugs or hemoptysis or cp or chest tightness, subjective wheeze or overt sinus or hb symptoms.   sleeping without nocturnal  or early am exacerbation  of respiratory  c/o's or need for noct saba. Also denies any obvious fluctuation of symptoms with weather or environmental changes or other aggravating or alleviating factors except as outlined above   No unusual exposure hx or h/o childhood pna/ asthma or knowledge of premature birth.  Current Allergies, Complete Past Medical History, Past Surgical History, Family History, and Social History were reviewed in [employer] record.  ROS  The following are not active complaints unless bolded Hoarseness, sore throat, dysphagia, dental problems, itching, sneezing,  nasal congestion or discharge of excess mucus or purulent secretions, ear ache,   fever, chills, sweats, unintended wt loss or wt gain, classically pleuritic or exertional cp,  orthopnea pnd or arm/hand swelling  or leg swelling, presyncope, palpitations, abdominal pain, anorexia, nausea, vomiting, diarrhea  or change in bowel habits or change in bladder habits, change in  stools or change in urine, dysuria, hematuria,  rash, arthralgias, visual complaints, headache, numbness, weakness or ataxia or problems with walking or coordination,  change in mood or  memory.        Current Meds  Medication Sig   acetaminophen (TYLENOL) 325 MG tablet Take 2 tablets (650 mg total) by mouth every 6 (six) hours as needed for fever, moderate pain, mild pain or headache.   albuterol (PROVENTIL HFA;VENTOLIN HFA) 108 (90 Base) MCG/ACT inhaler Inhale 2 puffs into the lungs every 4 (four) hours as needed for wheezing or shortness of breath.   albuterol (PROVENTIL) (2.5 MG/3ML) 0.083% nebulizer solution Take 3 mLs (2.5 mg total) by nebulization every 6 (six) hours as needed for wheezing or shortness of breath.   albuterol (PROVENTIL) 4 MG tablet Take 1 tablet by  mouth 3 (three) times daily.   Budeson-Glycopyrrol-Formoterol (BREZTRI AEROSPHERE) 160-9-4.8 MCG/ACT AERO Inhale 1 Dose into the lungs daily.   Budeson-Glycopyrrol-Formoterol (BREZTRI AEROSPHERE) 160-9-4.8 MCG/ACT AERO Inhale 2 puffs into the lungs in the morning and at bedtime.   pantoprazole (PROTONIX) 40 MG tablet Take 40 mg by mouth 2 (two) times daily.   predniSONE (DELTASONE) 10 MG tablet Take  4 each am x3 days, 3 x 3days,   2 each am x 3 days,  1 each am x 3 days and stop   rosuvastatin (CRESTOR) 10 MG tablet Take 10 mg by mouth daily.   warfarin (COUMADIN) 5 MG tablet Take 5 mg by mouth daily.           Objective:    Wt Readings from Last 3 Encounters:  07/07/22 124 lb (56.2 kg)  06/23/22 122 lb (55.3 kg)  05/15/22 122 lb 12.8 oz (55.7 kg)      Vital signs reviewed  07/07/2022  - Note at rest 02 sats  94% on RA   General appearance:    robust pleasant amb wm nad    HEENT : Oropharynx  clear      NECK :  without  apparent JVD/ palpable Nodes/TM    LUNGS: no acc muscle use,  Mild barrel  contour chest wall with bilateral  Distant bs s audible wheeze and  without cough on insp or exp maneuvers  and mild  Hyperresonant  to  percussion bilaterally     CV:  RRR  no s3 or murmur or increase in P2, and no edema   ABD:  soft and nontender with pos end  insp Hoover's  in the supine position.  No bruits or organomegaly appreciated   MS:  Nl gait/ ext warm without deformities Or obvious joint restrictions  calf tenderness, cyanosis or clubbing     SKIN: warm and dry without lesions    NEURO:  alert, approp, nl sensorium with  no motor or cerebellar deficits apparent.                .   Assessment

## 2022-07-07 NOTE — Patient Instructions (Addendum)
Best cough medicine > mucinex DM 1200 mg every 12 hours as needed   Work on inhaler technique:  relax and gently blow all the way out then take a nice smooth full deep breath back in, triggering the inhaler at same time you start breathing in.  Hold breath in for at least  5 seconds if you can. Blow out breztri out  thru nose. Rinse and gargle with water when done.  If mouth or throat bother you at all,  try brushing teeth/gums/tongue with arm and hammer toothpaste/ make a slurry and gargle and spit out.       Also  Ok to try albuterol 15 min before an activity (on alternating days)  that you know would usually make you short of breath and see if it makes any difference and if makes none then don't take albuterol after activity unless you can't catch your breath as this means it's the resting that helps, not the albuterol.      We will check to be sure the lab at [facility] can do [facility] test I ordered   Please schedule a follow up visit in 3-4  months but call sooner if needed with PFTs on return

## 2022-07-08 DIAGNOSIS — J449 Chronic obstructive pulmonary disease, unspecified: Secondary | ICD-10-CM | POA: Diagnosis not present

## 2022-07-08 DIAGNOSIS — Z9229 Personal history of other drug therapy: Secondary | ICD-10-CM | POA: Diagnosis not present

## 2022-07-08 DIAGNOSIS — Z0001 Encounter for general adult medical examination with abnormal findings: Secondary | ICD-10-CM | POA: Diagnosis not present

## 2022-07-08 DIAGNOSIS — E559 Vitamin D deficiency, unspecified: Secondary | ICD-10-CM | POA: Diagnosis not present

## 2022-07-08 NOTE — Assessment & Plan Note (Addendum)
Quit smoking 2020 - 05/15/2022  After extensive coaching inhaler device,  effectiveness =  75% from a baselin of maybe 25% so try breztri and approp saba p pred x 12 day taper  - 05/15/2022   Walked on RA  x  3  lap(s) =  approx 450  ft  @ brisk pace, stopped due to end of study  with lowest 02 sats 92%  with mild sob  - alpha one AT phenotype 07/08/2022 >>>    Group D (now reclassified as E) in terms of symptom/risk and laba/lama/ICS  therefore appropriate rx at this point >>>  continue breztri and prn saba  Re SABA :  I spent extra time with pt today reviewing appropriate use of albuterol for prn use on exertion with the following points: 1) saba is for relief of sob that does not improve by walking a slower pace or resting but rather if the pt does not improve after trying this first. 2) If the pt is convinced, as many are, that saba helps recover from activity faster then it's easy to tell if this is the case by re-challenging : ie stop, take the inhaler, then p 5 minutes try the exact same activity (intensity of workload) that just caused the symptoms and see if they are substantially diminished or not after saba 3) if there is an activity that reproducibly causes the symptoms, try the saba 15 min before the activity on alternate days   If in fact the saba really does help, then fine to continue to use it prn but advised may need to look closer at the maintenance regimen being used to achieve better control of airways disease with exertion.   F/u in 3-4 months with pfts          Each maintenance medication was reviewed in detail including emphasizing most importantly the difference between maintenance and prns and under what circumstances the prns are to be triggered using an action plan format where appropriate.  Total time for H and P, chart review, counseling, reviewing hfa  device(s) and generating customized AVS unique to this office visit / same day charting = 24 min

## 2022-07-10 LAB — ALPHA-1-ANTITRYPSIN PHENOTYP: A-1 Antitrypsin: 168 mg/dL (ref 101–187)

## 2022-07-23 DIAGNOSIS — I2699 Other pulmonary embolism without acute cor pulmonale: Secondary | ICD-10-CM | POA: Diagnosis not present

## 2022-07-23 DIAGNOSIS — D518 Other vitamin B12 deficiency anemias: Secondary | ICD-10-CM | POA: Diagnosis not present

## 2022-07-31 DIAGNOSIS — J449 Chronic obstructive pulmonary disease, unspecified: Secondary | ICD-10-CM | POA: Diagnosis not present

## 2022-07-31 DIAGNOSIS — Z681 Body mass index (BMI) 19 or less, adult: Secondary | ICD-10-CM | POA: Diagnosis not present

## 2022-07-31 DIAGNOSIS — Z7901 Long term (current) use of anticoagulants: Secondary | ICD-10-CM | POA: Diagnosis not present

## 2022-07-31 DIAGNOSIS — Z9229 Personal history of other drug therapy: Secondary | ICD-10-CM | POA: Diagnosis not present

## 2022-07-31 DIAGNOSIS — T50905A Adverse effect of unspecified drugs, medicaments and biological substances, initial encounter: Secondary | ICD-10-CM | POA: Diagnosis not present

## 2022-07-31 DIAGNOSIS — K219 Gastro-esophageal reflux disease without esophagitis: Secondary | ICD-10-CM | POA: Diagnosis not present

## 2022-08-22 DIAGNOSIS — D518 Other vitamin B12 deficiency anemias: Secondary | ICD-10-CM | POA: Diagnosis not present

## 2022-09-20 DIAGNOSIS — J449 Chronic obstructive pulmonary disease, unspecified: Secondary | ICD-10-CM | POA: Diagnosis not present

## 2022-09-20 DIAGNOSIS — K219 Gastro-esophageal reflux disease without esophagitis: Secondary | ICD-10-CM | POA: Diagnosis not present

## 2022-09-23 ENCOUNTER — Ambulatory Visit (INDEPENDENT_AMBULATORY_CARE_PROVIDER_SITE_OTHER): Payer: 59 | Admitting: Internal Medicine

## 2022-09-23 DIAGNOSIS — J449 Chronic obstructive pulmonary disease, unspecified: Secondary | ICD-10-CM

## 2022-09-23 LAB — PULMONARY FUNCTION TEST
DL/VA % pred: 53 %
DL/VA: 2.21 ml/min/mmHg/L
DLCO cor % pred: 38 %
DLCO cor: 9.64 ml/min/mmHg
DLCO unc % pred: 38 %
DLCO unc: 9.64 ml/min/mmHg
FEF 25-75 Post: 0.47 L/s
FEF 25-75 Pre: 0.49 L/s
FEF2575-%Change-Post: -3 %
FEF2575-%Pred-Post: 19 %
FEF2575-%Pred-Pre: 20 %
FEV1-%Change-Post: -2 %
FEV1-%Pred-Post: 35 %
FEV1-%Pred-Pre: 36 %
FEV1-Post: 1.11 L
FEV1-Pre: 1.14 L
FEV1FVC-%Change-Post: -1 %
FEV1FVC-%Pred-Pre: 56 %
FEV6-%Change-Post: -1 %
FEV6-%Pred-Post: 64 %
FEV6-%Pred-Pre: 65 %
FEV6-Post: 2.56 L
FEV6-Pre: 2.61 L
FEV6FVC-%Change-Post: 0 %
FEV6FVC-%Pred-Post: 100 %
FEV6FVC-%Pred-Pre: 101 %
FVC-%Change-Post: 0 %
FVC-%Pred-Post: 64 %
FVC-%Pred-Pre: 64 %
FVC-Post: 2.7 L
FVC-Pre: 2.73 L
Post FEV1/FVC ratio: 41 %
Post FEV6/FVC ratio: 95 %
Pre FEV1/FVC ratio: 42 %
Pre FEV6/FVC Ratio: 95 %
RV % pred: 254 %
RV: 5.79 L
TLC % pred: 130 %
TLC: 8.64 L

## 2022-09-23 NOTE — Progress Notes (Signed)
Full PFT performed today. °

## 2022-09-23 NOTE — Patient Instructions (Signed)
Full PFT performed today. °

## 2022-09-24 DIAGNOSIS — D518 Other vitamin B12 deficiency anemias: Secondary | ICD-10-CM | POA: Diagnosis not present

## 2022-09-28 NOTE — Progress Notes (Unsigned)
Robert Duarte, male    DOB: [birth year]    MRN: [id number]   Brief patient profile:  69  yowm quit smoking 2020/MM  referred to pulmonary clinic in [place]  05/15/2022 by Dr Sherwood Gambler  for copd eval s/p aecopd  > ER  04/03/22  - proved to have GOLD 3 criteria 09/23/22    History of Present Illness  05/15/2022  Pulmonary/ 1st office eval/ Kadeja Granada / [place]  Office  maint on albuterol /ppi bid ac / breztri  Chief Complaint  Patient presents with   Consult    Pt consult for COPD states that he was dx in 2020 - quit smoking. He reports he uses albuterol very frequently.   Dyspnea:  MMRC2 = can't walk a nl pace on a flat grade s sob but does fine slow and flat  Cough: esp at hs if hot > min mucoid Sleep: bed is level with 2 pillow  SABA use: avg bid hfa/ tid  02: none  Usually pred feels "like he can walk block 3 "x but only does once even on best days Rec Plan A = Automatic = Always=    Breztri Take 2 puffs first thing in am and then another 2 puffs about 12 hours later.  Work on inhaler technique:   Plan B = Backup (to supplement plan A, not to replace it) Only use your albuterol inhaler as a rescue medication Plan C = Crisis (instead of Plan B but only if Plan B stops working) - only use your albuterol nebulizer if you first try Plan B  Also  Ok to try albuterol 15 min before an activity (on alternating days)  that you know would usually make you short of breath   Prednisone Take 4 for three days,  3 for three days, 2 for three days,  1 for three days and stop   After 5 days of prednisone please stop the albuterol pills          07/07/2022  f/u ov/[place] office/Darlena Koval re: doe  maint on breztri    Chief Complaint  Patient presents with   Follow-up    Pt f/u states that he is doing well. No current questions or concerns.    Dyspnea:  walking 20 min some hills Cough: none  Sleeping: flat bed x 2 pillows fine  SABA use: once or twice daily  02: none  Rec Best cough  medicine > mucinex DM 1200 mg every 12 hours as needed  Work on inhaler technique:  Also  Ok to try albuterol 15 min before an activity (on alternating days)  that you know would usually make you short of breath  We will check to be sure the lab at Dr [facility] can do [facility] test I ordered       09/29/2022 3 m  f/u ov/[place] office/Kamille Toomey re: GOLD 3 copd/ab maint on ***  No chief complaint on file.   Dyspnea:  *** Cough: *** Sleeping: ***   resp cc  SABA use: *** 02: ***  Lung cancer screening: ***   No obvious day to day or daytime variability or assoc excess/ purulent sputum or mucus plugs or hemoptysis or cp or chest tightness, subjective wheeze or overt sinus or hb symptoms.    Also denies any obvious fluctuation of symptoms with weather or environmental changes or other aggravating or alleviating factors except as outlined above   No unusual exposure hx or h/o childhood pna/ asthma or knowledge of premature  birth.  Current Allergies, Complete Past Medical History, Past Surgical History, Family History, and Social History were reviewed in [employer] record.  ROS  The following are not active complaints unless bolded Hoarseness, sore throat, dysphagia, dental problems, itching, sneezing,  nasal congestion or discharge of excess mucus or purulent secretions, ear ache,   fever, chills, sweats, unintended wt loss or wt gain, classically pleuritic or exertional cp,  orthopnea pnd or arm/hand swelling  or leg swelling, presyncope, palpitations, abdominal pain, anorexia, nausea, vomiting, diarrhea  or change in bowel habits or change in bladder habits, change in stools or change in urine, dysuria, hematuria,  rash, arthralgias, visual complaints, headache, numbness, weakness or ataxia or problems with walking or coordination,  change in mood or  memory.        No outpatient medications have been marked as taking for the 09/29/22 encounter (Appointment) with Nyoka Cowden, MD.                       Objective:   09/29/2022         ***  07/07/22 124 lb (56.2 kg)  06/23/22 122 lb (55.3 kg)  05/15/22 122 lb 12.8 oz (55.7 kg)    Vital signs reviewed  09/29/2022  - Note at rest 02 sats  ***% on ***   General appearance:    ***     Mild bar***              .   Assessment

## 2022-09-29 ENCOUNTER — Ambulatory Visit (INDEPENDENT_AMBULATORY_CARE_PROVIDER_SITE_OTHER): Payer: 59 | Admitting: Internal Medicine

## 2022-09-29 ENCOUNTER — Encounter: Payer: Self-pay | Admitting: Internal Medicine

## 2022-09-29 VITALS — BP 146/84 | HR 89 | Wt 129.2 lb

## 2022-09-29 DIAGNOSIS — R911 Solitary pulmonary nodule: Secondary | ICD-10-CM | POA: Insufficient documentation

## 2022-09-29 DIAGNOSIS — Z87891 Personal history of nicotine dependence: Secondary | ICD-10-CM | POA: Diagnosis not present

## 2022-09-29 DIAGNOSIS — J449 Chronic obstructive pulmonary disease, unspecified: Secondary | ICD-10-CM

## 2022-09-29 NOTE — Assessment & Plan Note (Signed)
Low-dose CT lung cancer screening is recommended for patients who are 51-67 years of age with a 20+ pack-year history of smoking and who are currently smoking or quit <=15 years ago. No coughing up blood  No unintentional weight loss of > 15 pounds in the last 6 months - pt is eligible for scanning yearly until 2035   Ok to resume LDSCT p w/u copmlete         Each maintenance medication was reviewed in detail including emphasizing most importantly the difference between maintenance and prns and under what circumstances the prns are to be triggered using an action plan format where appropriate.  Total time for H and P, chart review, counseling, reviewing hfa device(s) and generating customized AVS unique to this office visit / same day charting = 31 min

## 2022-09-29 NOTE — Patient Instructions (Addendum)
For cough/ congestion :  mucinex dm 1200 mg every 12 hours as needed   My office will be contacting you by phone for referral to CT chest   - if you don't hear back from my office within one week please call us back or notify us thru MyChart and we'll address it right away.   Work on inhaler technique:  relax and gently blow all the way out then take a nice smooth full deep breath back in, triggering the inhaler at same time you start breathing in.  Hold breath in for at least  5 seconds if you can. Blow out breztri thru nose. Rinse and gargle with water when done.  If mouth or throat bother you at all,  try brushing teeth/gums/tongue with arm and hammer toothpaste/ make a slurry and gargle and spit out.       Please schedule a follow up visit in 6 months but call sooner if needed

## 2022-09-29 NOTE — Assessment & Plan Note (Signed)
See CT chest 04/02/2012 > repeat 09/29/2022 >>>   Discussed in detail all the  indications, usual  risks and alternatives  relative to the benefits with patient who agrees to proceed with w/u as outlined.

## 2022-09-29 NOTE — Assessment & Plan Note (Signed)
Quit smoking 2020/MM - 05/15/2022  After extensive coaching inhaler device,  effectiveness =  75% from a baselin of maybe 25% so try breztri and approp saba p pred x 12 day taper  - 05/15/2022   Walked on RA  x  3  lap(s) =  approx 450  ft  @ brisk pace, stopped due to end of study  with lowest 02 sats 92%  with mild sob  - alpha one AT phenotype 07/08/2022 >>> MM/ level 168 - PFT's 09/23/22  1.14  FEV1 1.14 (36 % ) ratio 0.42  p 0 % improvement from saba p Breztri prior to study with DLCO  9.64 (38%)   and FV curve classically concave   - 09/29/2022  After extensive coaching inhaler device,  effectiveness =  75% (short Ti)    Group D (now reclassified as E) in terms of symptom/risk and laba/lama/ICS  therefore appropriate rx at this point >>>  breztri and now on more approp saba

## 2022-09-30 ENCOUNTER — Ambulatory Visit: Payer: 59 | Admitting: Internal Medicine

## 2022-10-17 DIAGNOSIS — R6889 Other general symptoms and signs: Secondary | ICD-10-CM | POA: Diagnosis not present

## 2022-10-17 DIAGNOSIS — Z681 Body mass index (BMI) 19 or less, adult: Secondary | ICD-10-CM | POA: Diagnosis not present

## 2022-10-17 DIAGNOSIS — J449 Chronic obstructive pulmonary disease, unspecified: Secondary | ICD-10-CM | POA: Diagnosis not present

## 2022-10-17 DIAGNOSIS — J441 Chronic obstructive pulmonary disease with (acute) exacerbation: Secondary | ICD-10-CM | POA: Diagnosis not present

## 2022-10-17 DIAGNOSIS — J0100 Acute maxillary sinusitis, unspecified: Secondary | ICD-10-CM | POA: Diagnosis not present

## 2022-10-21 ENCOUNTER — Ambulatory Visit (HOSPITAL_COMMUNITY): Payer: 59

## 2022-10-26 ENCOUNTER — Ambulatory Visit (HOSPITAL_COMMUNITY)
Admission: RE | Admit: 2022-10-26 | Discharge: 2022-10-26 | Disposition: A | Discharge status: Home / Self Care | Payer: 59 | Source: Ambulatory Visit | Attending: Internal Medicine | Admitting: Internal Medicine

## 2022-10-26 DIAGNOSIS — R911 Solitary pulmonary nodule: Secondary | ICD-10-CM | POA: Insufficient documentation

## 2022-10-26 DIAGNOSIS — I700 Atherosclerosis of aorta: Secondary | ICD-10-CM | POA: Diagnosis not present

## 2022-10-26 DIAGNOSIS — J432 Centrilobular emphysema: Secondary | ICD-10-CM | POA: Diagnosis not present

## 2022-10-26 MED ORDER — IOHEXOL 350 MG/ML SOLN: 75.0000 mL | Freq: Once | INTRAVENOUS | Status: DC | PRN

## 2022-10-29 DIAGNOSIS — D518 Other vitamin B12 deficiency anemias: Secondary | ICD-10-CM | POA: Diagnosis not present

## 2022-11-04 ENCOUNTER — Telehealth: Payer: Self-pay

## 2022-11-04 NOTE — Telephone Encounter (Signed)
I left a message for the patient to return my call.

## 2022-11-04 NOTE — Telephone Encounter (Signed)
-----   Message from Sandrea Hughs sent at 11/04/2022 11:53 AM EDT ----- Call patient :  Study is unremarkable, no change in recs

## 2022-11-12 NOTE — Telephone Encounter (Signed)
Patient is returning Annabell Sabal, CMAs call from 11/04/22 regarding CT results.  Please call [phone]

## 2022-11-12 NOTE — Telephone Encounter (Signed)
Attempted to contact patient multiple times. MyChart message sent.

## 2023-01-19 DIAGNOSIS — R6889 Other general symptoms and signs: Secondary | ICD-10-CM | POA: Diagnosis not present

## 2023-01-19 DIAGNOSIS — Z681 Body mass index (BMI) 19 or less, adult: Secondary | ICD-10-CM | POA: Diagnosis not present

## 2023-01-19 DIAGNOSIS — U071 COVID-19: Secondary | ICD-10-CM | POA: Diagnosis not present

## 2023-01-19 DIAGNOSIS — J449 Chronic obstructive pulmonary disease, unspecified: Secondary | ICD-10-CM | POA: Diagnosis not present

## 2023-03-04 DIAGNOSIS — K219 Gastro-esophageal reflux disease without esophagitis: Secondary | ICD-10-CM | POA: Diagnosis not present

## 2023-03-04 DIAGNOSIS — J449 Chronic obstructive pulmonary disease, unspecified: Secondary | ICD-10-CM | POA: Diagnosis not present

## 2023-03-04 DIAGNOSIS — J441 Chronic obstructive pulmonary disease with (acute) exacerbation: Secondary | ICD-10-CM | POA: Diagnosis not present

## 2023-03-04 DIAGNOSIS — R6889 Other general symptoms and signs: Secondary | ICD-10-CM | POA: Diagnosis not present

## 2023-03-04 DIAGNOSIS — J0100 Acute maxillary sinusitis, unspecified: Secondary | ICD-10-CM | POA: Diagnosis not present

## 2023-03-04 DIAGNOSIS — Z681 Body mass index (BMI) 19 or less, adult: Secondary | ICD-10-CM | POA: Diagnosis not present

## 2023-03-29 NOTE — Progress Notes (Unsigned)
 Robert Duarte, male    DOB: [birth year]    MRN: [id number]   Brief patient profile:  19  yowm quit smoking 2020/MM  referred to pulmonary clinic in [place]  05/15/2022 by Dr Sherwood Gambler  for copd eval s/p aecopd  > ER  04/03/22   - proved to have GOLD 3 COPD  criteria 09/23/22    History of Present Illness  05/15/2022  Pulmonary/ 1st office eval/ Keylen Eckenrode / [place]  Office  maint on albuterol /ppi bid ac / breztri  Chief Complaint  Patient presents with   Consult    Pt consult for COPD states that he was dx in 2020 - quit smoking. He reports he uses albuterol very frequently.   Dyspnea:  MMRC2 = can't walk a nl pace on a flat grade s sob but does fine slow and flat  Cough: esp at hs if hot > min mucoid Sleep: bed is level with 2 pillow  SABA use: avg bid hfa/ tid  02: none  Usually pred feels "like he can walk block 3 "x but only does once even on best days Rec Plan A = Automatic = Always=    Breztri Take 2 puffs first thing in am and then another 2 puffs about 12 hours later.  Work on inhaler technique:   Plan B = Backup (to supplement plan A, not to replace it) Only use your albuterol inhaler as a rescue medication Plan C = Crisis (instead of Plan B but only if Plan B stops working) - only use your albuterol nebulizer if you first try Plan B  Also  Ok to try albuterol 15 min before an activity (on alternating days)  that you know would usually make you short of breath   Prednisone Take 4 for three days,  3 for three days, 2 for three days,  1 for three days and stop   After 5 days of prednisone please stop the albuterol pills      07/07/2022  f/u ov/[place] office/Garyson Stelly re: doe  maint on breztri    Chief Complaint  Patient presents with   Follow-up    Pt f/u states that he is doing well. No current questions or concerns.    Dyspnea:  walking 20 min some hills Cough: none  Sleeping: flat bed x 2 pillows fine  SABA use: once or twice daily  02: none  Rec Best cough  medicine > mucinex DM 1200 mg every 12 hours as needed  Work on inhaler technique:  Also  Ok to try albuterol 15 min before an activity (on alternating days)  that you know would usually make you short of breath        09/29/2022 3 m  f/u ov/[place] office/Jermarion Poffenberger re: GOLD 3 copd/ab maint on breztri  / no need for saba po Dyspnea:  pulling trash house to street otherwise really not limited from desired activities  Cough: minimal / mucoid  Sleeping: flat bed/ 2 pillows  s    resp cc  SABA use: maybe once a day  02: none  Rec For cough/ congestion :  mucinex dm 1200 mg every 12 hours as needed  My office will be contacting you by phone for referral to CT chest  > benign scarring, no more directed f/u needed  Work on inhaler technique:       03/30/2023  f/u ov/ office/Mikella Linsley re: GOLD 3 copd maint on breztri  needs redirect for annual LDSCT  done  Chief Complaint  Patient presents with   Follow-up    copd   Dyspnea:  no change  MMRC2 = can't walk a nl pace on a flat grade s sob but does fine slow and flat as long as not carrying stuff  Cough: some am congestion several minutes  Sleeping: flat bed/ 2 pillows s   resp cc  SABA use: avg once a day  02: none  Lung cancer screening: referred today  L apical ant cp not related to cp once or twice a week  x a few min   No obvious day to day or daytime variability or assoc excess/ purulent sputum or mucus plugs or hemoptysis or   chest tightness, subjective wheeze or overt sinus or hb symptoms.    Also denies any obvious fluctuation of symptoms with weather or environmental changes or other aggravating or alleviating factors except as outlined above   No unusual exposure hx or h/o childhood pna/ asthma or knowledge of premature birth.  Current Allergies, Complete Past Medical History, Past Surgical History, Family History, and Social History were reviewed in [employer] record.  ROS  The following are not  active complaints unless bolded Hoarseness, sore throat, dysphagia, dental problems, itching, sneezing,  nasal congestion or discharge of excess mucus or purulent secretions, ear ache,   fever, chills, sweats, unintended wt loss or wt gain, classically pleuritic or exertional cp,  orthopnea pnd or arm/hand swelling  or leg swelling, presyncope, palpitations, abdominal pain, anorexia, nausea, vomiting, diarrhea  or change in bowel habits or change in bladder habits, change in stools or change in urine, dysuria, hematuria,  rash, arthralgias, visual complaints, headache, numbness, weakness or ataxia or problems with walking or coordination,  change in mood or  memory.        Current Meds  Medication Sig   acetaminophen (TYLENOL) 325 MG tablet Take 2 tablets (650 mg total) by mouth every 6 (six) hours as needed for fever, moderate pain, mild pain or headache.   albuterol (PROVENTIL HFA;VENTOLIN HFA) 108 (90 Base) MCG/ACT inhaler Inhale 2 puffs into the lungs every 4 (four) hours as needed for wheezing or shortness of breath.   albuterol (PROVENTIL) (2.5 MG/3ML) 0.083% nebulizer solution Take 3 mLs (2.5 mg total) by nebulization every 6 (six) hours as needed for wheezing or shortness of breath.   albuterol (PROVENTIL) 4 MG tablet Take 1 tablet by mouth 3 (three) times daily.   Budeson-Glycopyrrol-Formoterol (BREZTRI AEROSPHERE) 160-9-4.8 MCG/ACT AERO Inhale 1 Dose into the lungs daily.   ezetimibe (ZETIA) 10 MG tablet Take 10 mg by mouth daily.   pantoprazole (PROTONIX) 40 MG tablet Take 40 mg by mouth 2 (two) times daily.   [DISCONTINUED] Budeson-Glycopyrrol-Formoterol (BREZTRI AEROSPHERE) 160-9-4.8 MCG/ACT AERO Inhale 2 puffs into the lungs in the morning and at bedtime.   [DISCONTINUED] warfarin (COUMADIN) 5 MG tablet Take 5 mg by mouth daily.                             Objective:   Wts  03/30/2023       124 09/29/2022         129  07/07/22 124 lb (56.2 kg)  06/23/22 122 lb (55.3 kg)   05/15/22 122 lb 12.8 oz (55.7 kg)     Vital signs reviewed  03/30/2023  - Note at rest 02 sats  95% on RA   General appearance:    amb pleasant  wm nad   HEENT : Oropharynx  clear   Nasal turbinates nl    NECK :  without  apparent JVD/ palpable Nodes/TM    LUNGS: no acc muscle use,  Mild barrel  contour chest wall with bilateral  Distant bs s audible wheeze and  without cough on insp or exp maneuvers  and mild  Hyperresonant  to  percussion bilaterally     CV:  RRR  no s3 or murmur or increase in P2, and no edema   ABD:  soft and nontender with pos end  insp Hoover's  in the supine position.  No bruits or organomegaly appreciated   MS:  Nl gait/ ext warm without deformities Or obvious joint restrictions  calf tenderness, cyanosis or clubbing     SKIN: warm and dry without lesions    NEURO:  alert, approp, nl sensorium with  no motor or cerebellar deficits apparent.        Assessment

## 2023-03-30 ENCOUNTER — Encounter: Payer: Self-pay | Admitting: Internal Medicine

## 2023-03-30 ENCOUNTER — Ambulatory Visit: Payer: 59 | Admitting: Internal Medicine

## 2023-03-30 VITALS — BP 124/80 | HR 92 | Ht 68.0 in | Wt 124.2 lb

## 2023-03-30 DIAGNOSIS — J449 Chronic obstructive pulmonary disease, unspecified: Secondary | ICD-10-CM | POA: Diagnosis not present

## 2023-03-30 DIAGNOSIS — Z87891 Personal history of nicotine dependence: Secondary | ICD-10-CM

## 2023-03-30 NOTE — Patient Instructions (Signed)
 My office will be contacting you by phone for referral to lung cancer screening program   - if you don't hear back from my office within one week please call us back or notify us thru MyChart and we'll address it right away.   Work on inhaler technique:  relax and gently blow all the way out then take a nice smooth full deep breath back in, triggering the inhaler at same time you start breathing in.  Hold breath in for at least  5 seconds if you can. Blow out breztri  thru nose. Rinse and gargle with water when done.  If mouth or throat bother you at all,  try brushing teeth/gums/tongue with arm and hammer toothpaste/ make a slurry and gargle and spit out.     Ok to try albuterol 15 min before an activity (on alternating days)  that you know would usually make you short of breath and see if it makes any difference and if makes none then don't take albuterol after activity unless you can't catch your breath as this means it's the resting that helps, not the albuterol.   Please schedule a follow up visit in 6  months but call sooner if needed

## 2023-03-30 NOTE — Assessment & Plan Note (Addendum)
 Quit smoking 2020 >>> Placed in [facility]  03/30/2023 until 2035   Discussed in detail all the  indications, usual  risks and alternatives  relative to the benefits with patient who agrees to proceed with w/u as outlined.             Each maintenance medication was reviewed in detail including emphasizing most importantly the difference between maintenance and prns and under what circumstances the prns are to be triggered using an action plan format where appropriate.  Total time for H and P, chart review, counseling, reviewing hfa device(s) and generating customized AVS unique to this office visit / same day charting = 25 min

## 2023-03-30 NOTE — Assessment & Plan Note (Signed)
 Quit smoking 2020/MM - 05/15/2022  After extensive coaching inhaler device,  effectiveness =  75% from a baselin of maybe 25% so try breztri and approp saba p pred x 12 day taper  - 05/15/2022   Walked on RA  x  3  lap(s) =  approx 450  ft  @ brisk pace, stopped due to end of study  with lowest 02 sats 92%  with mild sob  - alpha one AT phenotype 07/08/2022 >>> MM/ level 168 - PFT's 09/23/22  1.14  FEV1 1.14 (36 % ) ratio 0.42  p 0 % improvement from saba p Breztri prior to study with DLCO  9.64 (38%)   and FV curve classically concave   - CT chest 10/26/22  Advanced changes of centrilobular and paraseptal emphysema - 03/30/2023  After extensive coaching inhaler device,  effectiveness =    80% (short ti) > continue breztri    Group D (now reclassified as E) in terms of symptom/risk and laba/lama/ICS  therefore appropriate rx at this point >>>  breztri and approp saba   F/u q 6 m

## 2023-05-29 DIAGNOSIS — J0100 Acute maxillary sinusitis, unspecified: Secondary | ICD-10-CM | POA: Diagnosis not present

## 2023-05-29 DIAGNOSIS — Z681 Body mass index (BMI) 19 or less, adult: Secondary | ICD-10-CM | POA: Diagnosis not present

## 2023-05-29 DIAGNOSIS — J449 Chronic obstructive pulmonary disease, unspecified: Secondary | ICD-10-CM | POA: Diagnosis not present

## 2023-05-30 ENCOUNTER — Emergency Department (HOSPITAL_COMMUNITY)

## 2023-05-30 ENCOUNTER — Emergency Department (HOSPITAL_COMMUNITY)
Admission: EM | Admit: 2023-05-30 | Discharge: 2023-05-30 | Disposition: A | Discharge status: Home / Self Care | Attending: Student | Admitting: Student

## 2023-05-30 ENCOUNTER — Other Ambulatory Visit: Payer: Self-pay

## 2023-05-30 ENCOUNTER — Encounter (HOSPITAL_COMMUNITY): Payer: Self-pay | Admitting: *Deleted

## 2023-05-30 DIAGNOSIS — Z7951 Long term (current) use of inhaled steroids: Secondary | ICD-10-CM | POA: Diagnosis not present

## 2023-05-30 DIAGNOSIS — K7689 Other specified diseases of liver: Secondary | ICD-10-CM | POA: Diagnosis not present

## 2023-05-30 DIAGNOSIS — R0781 Pleurodynia: Secondary | ICD-10-CM | POA: Diagnosis not present

## 2023-05-30 DIAGNOSIS — R000 Tachycardia, unspecified: Secondary | ICD-10-CM | POA: Insufficient documentation

## 2023-05-30 DIAGNOSIS — R079 Chest pain, unspecified: Secondary | ICD-10-CM | POA: Diagnosis present

## 2023-05-30 DIAGNOSIS — I509 Heart failure, unspecified: Secondary | ICD-10-CM | POA: Insufficient documentation

## 2023-05-30 DIAGNOSIS — R0602 Shortness of breath: Secondary | ICD-10-CM | POA: Insufficient documentation

## 2023-05-30 DIAGNOSIS — R058 Other specified cough: Secondary | ICD-10-CM | POA: Diagnosis not present

## 2023-05-30 DIAGNOSIS — J449 Chronic obstructive pulmonary disease, unspecified: Secondary | ICD-10-CM | POA: Diagnosis not present

## 2023-05-30 DIAGNOSIS — R918 Other nonspecific abnormal finding of lung field: Secondary | ICD-10-CM | POA: Diagnosis not present

## 2023-05-30 DIAGNOSIS — J439 Emphysema, unspecified: Secondary | ICD-10-CM | POA: Diagnosis not present

## 2023-05-30 DIAGNOSIS — R0789 Other chest pain: Secondary | ICD-10-CM | POA: Diagnosis not present

## 2023-05-30 LAB — CBC WITH DIFFERENTIAL/PLATELET
Abs Immature Granulocytes: 0.05 10*3/uL (ref 0.00–0.07)
Basophils Absolute: 0 10*3/uL (ref 0.0–0.1)
Basophils Relative: 0 %
Eosinophils Absolute: 0 10*3/uL (ref 0.0–0.5)
Eosinophils Relative: 0 %
HCT: 41.3 % (ref 39.0–52.0)
Hemoglobin: 14 g/dL (ref 13.0–17.0)
Immature Granulocytes: 1 %
Lymphocytes Relative: 4 %
Lymphs Abs: 0.4 10*3/uL — ABNORMAL LOW (ref 0.7–4.0)
MCH: 30.4 pg (ref 26.0–34.0)
MCHC: 33.9 g/dL (ref 30.0–36.0)
MCV: 89.8 fL (ref 80.0–100.0)
Monocytes Absolute: 0.8 10*3/uL (ref 0.1–1.0)
Monocytes Relative: 8 %
Neutro Abs: 9 10*3/uL — ABNORMAL HIGH (ref 1.7–7.7)
Neutrophils Relative %: 87 %
Platelets: 168 10*3/uL (ref 150–400)
RBC: 4.6 MIL/uL (ref 4.22–5.81)
RDW: 13.9 % (ref 11.5–15.5)
WBC: 10.3 10*3/uL (ref 4.0–10.5)
nRBC: 0 % (ref 0.0–0.2)

## 2023-05-30 LAB — BASIC METABOLIC PANEL WITH GFR
Anion gap: 10 (ref 5–15)
BUN: 17 mg/dL (ref 8–23)
CO2: 23 mmol/L (ref 22–32)
Calcium: 8.9 mg/dL (ref 8.9–10.3)
Chloride: 103 mmol/L (ref 98–111)
Creatinine, Ser: 1.21 mg/dL (ref 0.61–1.24)
GFR, Estimated: >60 mL/min (ref >60)
Glucose, Bld: 137 mg/dL — ABNORMAL HIGH (ref 70–99)
Potassium: 3.6 mmol/L (ref 3.5–5.1)
Sodium: 136 mmol/L (ref 135–145)

## 2023-05-30 LAB — TROPONIN I (HIGH SENSITIVITY): Troponin I (High Sensitivity): 5 ng/L (ref <18)

## 2023-05-30 MED ORDER — IOHEXOL 350 MG/ML SOLN
75.0000 mL | Freq: Once | INTRAVENOUS | Status: AC | PRN
  Administered 2023-05-30: 75 mL via INTRAVENOUS

## 2023-05-30 NOTE — ED Provider Notes (Signed)
 [place] EMERGENCY DEPARTMENT AT [facility] Provider Note   CSN: [id number] Arrival date & time: 05/30/23  1643     History  Chief Complaint  Patient presents with   Chest Pain    Robert Duarte is a 68 y.o. male with a history including CHF, emphysema, prior  pulmonary embolism who is not currently anticoagulated, was on Coumadin which was discontinued last year, describing pleuritic right sided chest pain which is worsened when he lies on that side and reminds him of his last PE.  He does endorse chronic shortness of breath which is not specifically worsened today, he does have both albuterol  and Breztri , last albuterol  use was just prior to arrival.  He denies fevers or chills.  He has had a cough which has been productive of thick sputum, sometimes white but has had several episodes of brown to red flecks mixed in with the sputum.  He denies fevers or chills, dizziness, palpitations, nausea or vomiting.  The history is provided by the patient.       Home Medications Prior to Admission medications   Medication Sig Start Date End Date Taking? Authorizing Provider  acetaminophen  (TYLENOL ) 325 MG tablet Take 2 tablets (650 mg total) by mouth every 6 (six) hours as needed for fever, moderate pain, mild pain or headache. 10/31/21   Johnson, Clanford L, MD  albuterol  (PROVENTIL  HFA;VENTOLIN  HFA) 108 (90 Base) MCG/ACT inhaler Inhale 2 puffs into the lungs every 4 (four) hours as needed for wheezing or shortness of breath. 03/06/18   Early Glisson, MD  albuterol  (PROVENTIL ) (2.5 MG/3ML) 0.083% nebulizer solution Take 3 mLs (2.5 mg total) by nebulization every 6 (six) hours as needed for wheezing or shortness of breath. 04/03/22   Alissa April, MD  albuterol  (PROVENTIL ) 4 MG tablet Take 1 tablet by mouth 3 (three) times daily. 10/28/21   [provider]  Budeson-Glycopyrrol-Formoterol  (BREZTRI  AEROSPHERE) 160-9-4.8 MCG/ACT AERO Inhale 1 Dose into the lungs daily.     [provider]  ezetimibe (ZETIA) 10 MG tablet Take 10 mg by mouth daily. 09/03/22   [provider]  pantoprazole  (PROTONIX ) 40 MG tablet Take 40 mg by mouth 2 (two) times daily. 10/08/21   [provider]      Allergies    Penicillins and Yellow jacket venom [bee venom]    Review of Systems   Review of Systems  Physical Exam Updated Vital Signs BP (!) 133/95 (BP Location: Right Arm)   Pulse 88   Temp 98.7 F (37.1 C) (Oral)   Resp 18   Ht 5\' 8"  (1.727 m)   Wt 57.6 kg   SpO2 95%   BMI 19.31 kg/m  Physical Exam Vitals and nursing note reviewed.  Constitutional:      Appearance: He is well-developed.  HENT:     Head: Normocephalic and atraumatic.  Eyes:     Conjunctiva/sclera: Conjunctivae normal.  Cardiovascular:     Rate and Rhythm: Regular rhythm. Tachycardia present.     Heart sounds: Normal heart sounds.  Pulmonary:     Effort: Pulmonary effort is normal.     Breath sounds: Decreased breath sounds present. No wheezing.     Comments: Distant breath sounds all lung fields, no rhonchi, no wheeze. Abdominal:     General: Bowel sounds are normal.     Palpations: Abdomen is soft.     Tenderness: There is no abdominal tenderness.  Musculoskeletal:        General: Normal range of  motion.     Cervical back: Normal range of motion.     Right lower leg: No tenderness. No edema.     Left lower leg: No tenderness. No edema.  Skin:    General: Skin is warm and dry.  Neurological:     Mental Status: He is alert.     ED Results / Procedures / Treatments   Labs (all labs ordered are listed, but only abnormal results are displayed) Labs Reviewed  CBC WITH DIFFERENTIAL/PLATELET - Abnormal; Notable for the following components:      Result Value   Neutro Abs 9.0 (*)    Lymphs Abs 0.4 (*)    All other components within normal limits  BASIC METABOLIC PANEL WITH GFR - Abnormal; Notable for the following components:   Glucose, Bld 137 (*)    All  other components within normal limits    EKG EKG Interpretation Date/Time:  Saturday May 30 2023 16:52:27 EDT Ventricular Rate:  109 PR Interval:  154 QRS Duration:  92 QT Interval:  320 QTC Calculation: 430 R Axis:   92  Text Interpretation: Sinus tachycardia Right atrial enlargement Abnormal ECG When compared with ECG of 23-Jun-2022 19:48, PREVIOUS ECG IS PRESENT Confirmed by Kommor, Madison ([id number]) on 05/30/2023 7:19:51 PM  Radiology No results found.  Procedures Procedures    Medications Ordered in ED Medications  iohexol  (OMNIPAQUE ) 350 MG/ML injection 75 mL (75 mLs Intravenous Contrast Given 05/30/23 1802)    ED Course/ Medical Decision Making/ A&P                                 Medical Decision Making Patient with a several day history of pleuritic right-sided chest pain consistent with prior episode of PE.  He is not wheezing on exam and is not hypoxic although he did present with tachycardia.  Pending CT angio chest at this time.  He is relatively comfortable with his breathing at rest, would anticipate discharge home if his CT is negative.  Jeani Mill, PA-C assumes care.  Amount and/or Complexity of Data Reviewed Labs: ordered. Radiology: ordered.  Risk Prescription drug management.           Final Clinical Impression(s) / ED Diagnoses Final diagnoses:  None    Rx / DC Orders ED Discharge Orders     None         Amillya Chavira, PA-C 05/30/23 1928    [name], MD 06/02/23 1219

## 2023-05-30 NOTE — ED Provider Notes (Signed)
  Physical Exam  BP (!) 133/95 (BP Location: Right Arm)   Pulse 88   Temp 98.7 F (37.1 C) (Oral)   Resp 18   Ht 5\' 8"  (1.727 m)   Wt 57.6 kg   SpO2 95%   BMI 19.31 kg/m   Physical Exam  Procedures  Procedures  ED Course / MDM    Medical Decision Making Amount and/or Complexity of Data Reviewed Labs: ordered. Radiology: ordered.  Risk Prescription drug management.  Here for right rib pain for 2 days, history of COPD and also has history of PE.  Not on anticoagulation.  Worried about recurrent PE.  Pending PE study.  If CTA negative will plan on discharge.   The study negative, add a troponin out of abundance of caution and this is negative as well.  Patient was given Z-Pak and steroids at urgent care yesterday he states.  Having increased sputum production cough feels similar to his COPD.  Not consistent with ACS and reassuring workup.  He is requesting to go home.       Robert Duarte 05/30/23 2101    [name], MD 06/02/23 [phone]

## 2023-05-30 NOTE — ED Triage Notes (Signed)
 Pt with rt rib pain x 2 days. Pt states pain is worse with laying on right side.  Productive cough of yellow and brown in color. Denies any fever or chills.  Pt with hx COPD

## 2023-05-30 NOTE — ED Notes (Signed)
 Pt provided with urinal

## 2023-05-30 NOTE — Discharge Instructions (Addendum)
 Today in the ER for coughing and pain in your chest with breathing.  Fortunately you do not have a blood clot in your lungs, no sign of a problem with your heart.  Continue your inhalers and the Z-Pak you were prescribed.  Come back to the ER for new or worsening symptoms.  Follow-up closely with your primary care doctor.

## 2023-07-02 DIAGNOSIS — K219 Gastro-esophageal reflux disease without esophagitis: Secondary | ICD-10-CM | POA: Diagnosis not present

## 2023-07-02 DIAGNOSIS — Z0001 Encounter for general adult medical examination with abnormal findings: Secondary | ICD-10-CM | POA: Diagnosis not present

## 2023-07-02 DIAGNOSIS — Z681 Body mass index (BMI) 19 or less, adult: Secondary | ICD-10-CM | POA: Diagnosis not present

## 2023-07-02 DIAGNOSIS — J449 Chronic obstructive pulmonary disease, unspecified: Secondary | ICD-10-CM | POA: Diagnosis not present

## 2023-07-02 DIAGNOSIS — Z9229 Personal history of other drug therapy: Secondary | ICD-10-CM | POA: Diagnosis not present

## 2023-07-03 DIAGNOSIS — E559 Vitamin D deficiency, unspecified: Secondary | ICD-10-CM | POA: Diagnosis not present

## 2023-07-03 DIAGNOSIS — J449 Chronic obstructive pulmonary disease, unspecified: Secondary | ICD-10-CM | POA: Diagnosis not present

## 2023-07-03 DIAGNOSIS — D518 Other vitamin B12 deficiency anemias: Secondary | ICD-10-CM | POA: Diagnosis not present

## 2023-07-03 DIAGNOSIS — G9332 Myalgic encephalomyelitis/chronic fatigue syndrome: Secondary | ICD-10-CM | POA: Diagnosis not present

## 2023-10-06 ENCOUNTER — Encounter: Payer: Self-pay | Admitting: Internal Medicine

## 2023-10-06 ENCOUNTER — Ambulatory Visit (INDEPENDENT_AMBULATORY_CARE_PROVIDER_SITE_OTHER): Admitting: Internal Medicine

## 2023-10-06 VITALS — BP 128/83 | HR 80 | Ht 68.0 in | Wt 118.4 lb

## 2023-10-06 DIAGNOSIS — R911 Solitary pulmonary nodule: Secondary | ICD-10-CM

## 2023-10-06 DIAGNOSIS — K219 Gastro-esophageal reflux disease without esophagitis: Secondary | ICD-10-CM | POA: Diagnosis not present

## 2023-10-06 DIAGNOSIS — J449 Chronic obstructive pulmonary disease, unspecified: Secondary | ICD-10-CM

## 2023-10-06 NOTE — Assessment & Plan Note (Signed)
 Neg Chest CTa   05/30/23 > back to LDSCT yearly starting in 05/2024 advised

## 2023-10-06 NOTE — Patient Instructions (Addendum)
 Work on inhaler technique:  relax and gently blow all the way out then take a nice smooth full deep breath back in, triggering the inhaler at same time you start breathing in.  Hold breath in for at least  5 seconds if you can. Blow out breztri   thru nose. Rinse and gargle with water when done.  If mouth or throat bother you at all,  try brushing teeth/gums/tongue with arm and hammer toothpaste/ make a slurry and gargle and spit out.   My office will be contacting you by phone for referral to GI about your bad heartburn  - if you don't hear back from my office within one week please call us  back or notify us  thru MyChart and we'll address it right away.   Please schedule a follow up visit in 6 months but call sooner if needed

## 2023-10-06 NOTE — Progress Notes (Signed)
 Robert Duarte, male    DOB: 08-10-1955    MRN: [id number]   Brief patient profile:  69  yowm quit smoking 2020/MM  referred to pulmonary clinic in [place]  05/15/2022 by Dr Bertell  for copd eval s/p aecopd  > ER  04/03/22   - proved to have GOLD 3 COPD  criteria 09/23/22    History of Present Illness  05/15/2022  Pulmonary/ 1st office eval/ Andyn Sales / [place]  Office  maint on albuterol  /ppi bid ac / breztri   Chief Complaint  Patient presents with   Consult    Pt consult for COPD states that he was dx in 2020 - quit smoking. He reports he uses albuterol  very frequently.   Dyspnea:  MMRC2 = can't walk a nl pace on a flat grade s sob but does fine slow and flat  Cough: esp at hs if hot > min mucoid Sleep: bed is level with 2 pillow  SABA use: avg bid hfa/ tid  02: none  Usually pred feels like he can walk block 3 x but only does once even on best days Rec Plan A = Automatic = Always=    Breztri  Take 2 puffs first thing in am and then another 2 puffs about 12 hours later.  Work on inhaler technique:   Plan B = Backup (to supplement plan A, not to replace it) Only use your albuterol  inhaler as a rescue medication Plan C = Crisis (instead of Plan B but only if Plan B stops working) - only use your albuterol  nebulizer if you first try Plan B  Also  Ok to try albuterol  15 min before an activity (on alternating days)  that you know would usually make you short of breath  Prednisone  Take 4 for three days,  3 for three days, 2 for three days,  1 for three days and stop  After 5 days of prednisone  please stop the albuterol  pills     03/30/2023  f/u ov/[place] office/Efraim Vanallen re: GOLD 3 copd maint on breztri   needs redirect for annual LDSCT  done   Chief Complaint  Patient presents with   Follow-up    copd   Dyspnea:  no change  MMRC2 = can't walk a nl pace on a flat grade s sob but does fine slow and flat as long as not carrying stuff  Cough: some am congestion several minutes   Sleeping: flat bed/ 2 pillows s   resp cc  SABA use: avg once a day  02: none  Lung cancer screening: referred today  L apical ant cp not related to ex or breathing  once or twice a week  x a few min Rec  Work on inhaler technique:     Ok to try albuterol  15 min before an activity (on alternating days)  that you know would usually make you short of breath  Please schedule a follow up visit in 6  months but call sooner if needed    I personally reviewed images and agree with radiology impression as follows:   Chest CTa   05/30/23  1. No evidence of pulmonary embolism. 2. Extensive emphysematous lung disease with stable moderate severity biapical scarring and/or atelectasis, right greater than left. 3. Mild, stable posterior right upper lobe, right middle lobe and right basilar linear scarring.     10/06/2023  f/u ov/ office/Tikita Mabee re: GOLD 3 copd maint on breztri    Chief Complaint  Patient presents with   COPD  Shortness of Breath    doe  Dyspnea:  no change in tolerance still MMRC 2  - main issue is lifting heavy boxes - no ex cp Cough: attributes to to pnds  Sleeping: flat bed with 2 pillows s    resp cc  SABA use: once a day  hfa , neb 2 x per month   02: none Bad hb depending on what he eats despite ppi bi ac   No obvious day to day or daytime variability or assoc excess/ purulent sputum or mucus plugs or hemoptysis or cp or chest tightness, subjective wheeze     Also denies any obvious fluctuation of symptoms with weather or environmental changes or other aggravating or alleviating factors except as outlined above   No unusual exposure hx or h/o childhood pna/ asthma or knowledge of premature birth.  Current Allergies, Complete Past Medical History, Past Surgical History, Family History, and Social History were reviewed in [employer] record.  ROS  The following are not active complaints unless bolded Hoarseness, sore throat, dysphagia,  dental problems, itching, sneezing,  nasal congestion or discharge of excess mucus or purulent secretions, ear ache,   fever, chills, sweats, unintended wt loss or wt gain, classically pleuritic or exertional cp,  orthopnea pnd or arm/hand swelling  or leg swelling, presyncope, palpitations, abdominal pain, anorexia, nausea, vomiting, diarrhea  or change in bowel habits or change in bladder habits, change in stools or change in urine, dysuria, hematuria,  rash, arthralgias, visual complaints, headache, numbness, weakness or ataxia or problems with walking or coordination,  change in mood or  memory.        Current Meds  Medication Sig   acetaminophen  (TYLENOL ) 325 MG tablet Take 2 tablets (650 mg total) by mouth every 6 (six) hours as needed for fever, moderate pain, mild pain or headache.   albuterol  (PROVENTIL  HFA;VENTOLIN  HFA) 108 (90 Base) MCG/ACT inhaler Inhale 2 puffs into the lungs every 4 (four) hours as needed for wheezing or shortness of breath.   albuterol  (PROVENTIL ) (2.5 MG/3ML) 0.083% nebulizer solution Take 3 mLs (2.5 mg total) by nebulization every 6 (six) hours as needed for wheezing or shortness of breath.   albuterol  (PROVENTIL ) 4 MG tablet Take 1 tablet by mouth 3 (three) times daily.   Budeson-Glycopyrrol-Formoterol  (BREZTRI  AEROSPHERE) 160-9-4.8 MCG/ACT AERO Inhale 1 Dose into the lungs daily.   ezetimibe (ZETIA) 10 MG tablet Take 10 mg by mouth daily.   pantoprazole  (PROTONIX ) 40 MG tablet Take 40 mg by mouth 2 (two) times daily.          Objective:   Wts  10/06/2023       118  03/30/2023       124 09/29/2022         129  07/07/22 124 lb (56.2 kg)  06/23/22 122 lb (55.3 kg)  05/15/22 122 lb 12.8 oz (55.7 kg)    Vital signs reviewed  10/06/2023  - Note at rest 02 sats  99% on RA   General appearance:    amb wm nad   HEENT : Oropharynx  clear/edentulous   Nasal turbinates nl    NECK :  without  apparent JVD/ palpable Nodes/TM    LUNGS: no acc muscle use,  Mild  barrel  contour chest wall with bilateral  Distant bs s audible wheeze and  without cough on insp or exp maneuvers  and mild  Hyperresonant  to  percussion bilaterally     CV:  RRR  no s3 or murmur or increase in P2, and no edema   ABD:  soft and nontender   MS:  Nl gait/ ext warm without deformities Or obvious joint restrictions  calf tenderness, cyanosis or clubbing     SKIN: warm and dry without lesions    NEURO:  alert, approp, nl sensorium with  no motor or cerebellar deficits apparent.     Assessment   Assessment & Plan COPD GOLD 3 / AB Quit smoking 2020/MM - 05/15/2022  After extensive coaching inhaler device,  effectiveness =  75% from a baselin of maybe 25% so try breztri  and approp saba p pred x 12 day taper  - 05/15/2022   Walked on RA  x  3  lap(s) =  approx 450  ft  @ brisk pace, stopped due to end of study  with lowest 02 sats 92%  with mild sob  - alpha one AT phenotype 07/08/2022 >>> MM/ level 168 - PFT's 09/23/22  1.14  FEV1 1.14 (36 % ) ratio 0.42  p 0 % improvement from saba p Breztri  prior to study with DLCO  9.64 (38%)   and FV curve classically concave   - CT chest 10/26/22  Advanced changes of centrilobular and paraseptal emphysema  10/06/2023  After extensive coaching inhaler device,  effectiveness =    75% hfa (short breath hold)> continue breztri    Group E in terms of symptoms/risk so  laba/lama/ICS  therefore appropriate rx at this point >>>  breztri    and approp SABA prn.   Solitary pulmonary nodule on lung CT Neg Chest CTa   05/30/23 > back to LDSCT yearly starting in 05/2024 advised  Gastroesophageal reflux disease, unspecified whether esophagitis present 10/06/2023 reporting refractory to ppi bid > referred to GI of record in [place]  - very diet dependent/ reviewed ppi time and distinction between cardiac cp and HB symptoms with no evidence of a cardiac component at this point          Each maintenance medication was reviewed in detail including emphasizing  most importantly the difference between maintenance and prns and under what circumstances the prns are to be triggered using an action plan format where appropriate.  Total time for H and P, chart review, counseling, reviewing hfa  device(s) and generating customized AVS unique to this office visit / same day charting = 32 min          AVS  Patient Instructions  Work on inhaler technique:  relax and gently blow all the way out then take a nice smooth full deep breath back in, triggering the inhaler at same time you start breathing in.  Hold breath in for at least  5 seconds if you can. Blow out breztri   thru nose. Rinse and gargle with water when done.  If mouth or throat bother you at all,  try brushing teeth/gums/tongue with arm and hammer toothpaste/ make a slurry and gargle and spit out.   My office will be contacting you by phone for referral to GI about your bad heartburn  - if you don't hear back from my office within one week please call us  back or notify us  thru MyChart and we'll address it right away.   Please schedule a follow up visit in6 months but call sooner if needed        Ozell America, MD 10/06/2023

## 2023-10-06 NOTE — Assessment & Plan Note (Addendum)
 Quit smoking 2020/MM - 05/15/2022  After extensive coaching inhaler device,  effectiveness =  75% from a baselin of maybe 25% so try breztri  and approp saba p pred x 12 day taper  - 05/15/2022   Walked on RA  x  3  lap(s) =  approx 450  ft  @ brisk pace, stopped due to end of study  with lowest 02 sats 92%  with mild sob  - alpha one AT phenotype 07/08/2022 >>> MM/ level 168 - PFT's 09/23/22  1.14  FEV1 1.14 (36 % ) ratio 0.42  p 0 % improvement from saba p Breztri  prior to study with DLCO  9.64 (38%)   and FV curve classically concave   - CT chest 10/26/22  Advanced changes of centrilobular and paraseptal emphysema  10/06/2023  After extensive coaching inhaler device,  effectiveness =    75% hfa (short breath hold)> continue breztri    Group E in terms of symptoms/risk so  laba/lama/ICS  therefore appropriate rx at this point >>>  breztri    and approp SABA prn.

## 2023-10-06 NOTE — Assessment & Plan Note (Addendum)
 10/06/2023 reporting refractory to ppi bid > referred to GI of record in [place]  - very diet dependent/ reviewed ppi time and distinction between cardiac cp and HB symptoms with no evidence of a cardiac component at this point          Each maintenance medication was reviewed in detail including emphasizing most importantly the difference between maintenance and prns and under what circumstances the prns are to be triggered using an action plan format where appropriate.  Total time for H and P, chart review, counseling, reviewing hfa  device(s) and generating customized AVS unique to this office visit / same day charting = 32 min

## 2023-10-07 ENCOUNTER — Encounter (INDEPENDENT_AMBULATORY_CARE_PROVIDER_SITE_OTHER): Payer: Self-pay | Admitting: *Deleted

## 2023-10-16 ENCOUNTER — Other Ambulatory Visit: Payer: Self-pay

## 2023-10-16 ENCOUNTER — Emergency Department (HOSPITAL_COMMUNITY)

## 2023-10-16 ENCOUNTER — Encounter (HOSPITAL_COMMUNITY): Payer: Self-pay

## 2023-10-16 ENCOUNTER — Emergency Department (HOSPITAL_COMMUNITY): Admission: EM | Admit: 2023-10-16 | Discharge: 2023-10-16 | Disposition: A | Discharge status: Home / Self Care

## 2023-10-16 DIAGNOSIS — J449 Chronic obstructive pulmonary disease, unspecified: Secondary | ICD-10-CM | POA: Insufficient documentation

## 2023-10-16 DIAGNOSIS — M545 Low back pain, unspecified: Secondary | ICD-10-CM | POA: Diagnosis not present

## 2023-10-16 DIAGNOSIS — I700 Atherosclerosis of aorta: Secondary | ICD-10-CM | POA: Insufficient documentation

## 2023-10-16 DIAGNOSIS — N2889 Other specified disorders of kidney and ureter: Secondary | ICD-10-CM | POA: Insufficient documentation

## 2023-10-16 DIAGNOSIS — J439 Emphysema, unspecified: Secondary | ICD-10-CM | POA: Diagnosis not present

## 2023-10-16 DIAGNOSIS — R059 Cough, unspecified: Secondary | ICD-10-CM | POA: Diagnosis not present

## 2023-10-16 DIAGNOSIS — Z87442 Personal history of urinary calculi: Secondary | ICD-10-CM | POA: Insufficient documentation

## 2023-10-16 DIAGNOSIS — Z86711 Personal history of pulmonary embolism: Secondary | ICD-10-CM | POA: Diagnosis not present

## 2023-10-16 DIAGNOSIS — Z7951 Long term (current) use of inhaled steroids: Secondary | ICD-10-CM | POA: Insufficient documentation

## 2023-10-16 DIAGNOSIS — J168 Pneumonia due to other specified infectious organisms: Secondary | ICD-10-CM | POA: Diagnosis not present

## 2023-10-16 DIAGNOSIS — M8700 Idiopathic aseptic necrosis of unspecified bone: Secondary | ICD-10-CM

## 2023-10-16 DIAGNOSIS — J189 Pneumonia, unspecified organism: Secondary | ICD-10-CM

## 2023-10-16 DIAGNOSIS — R079 Chest pain, unspecified: Secondary | ICD-10-CM | POA: Diagnosis present

## 2023-10-16 DIAGNOSIS — R000 Tachycardia, unspecified: Secondary | ICD-10-CM | POA: Diagnosis not present

## 2023-10-16 DIAGNOSIS — M87052 Idiopathic aseptic necrosis of left femur: Secondary | ICD-10-CM | POA: Insufficient documentation

## 2023-10-16 DIAGNOSIS — R1031 Right lower quadrant pain: Secondary | ICD-10-CM | POA: Diagnosis not present

## 2023-10-16 LAB — URINALYSIS, ROUTINE W REFLEX MICROSCOPIC
Bilirubin Urine: NEGATIVE
Glucose, UA: 50 mg/dL — AB
Hgb urine dipstick: NEGATIVE
Ketones, ur: NEGATIVE mg/dL
Leukocytes,Ua: NEGATIVE
Nitrite: NEGATIVE
Protein, ur: NEGATIVE mg/dL
Specific Gravity, Urine: 1.019 (ref 1.005–1.030)
pH: 5 (ref 5.0–8.0)

## 2023-10-16 LAB — CBC
HCT: 39.5 % (ref 39.0–52.0)
Hemoglobin: 13 g/dL (ref 13.0–17.0)
MCH: 30 pg (ref 26.0–34.0)
MCHC: 32.9 g/dL (ref 30.0–36.0)
MCV: 91.2 fL (ref 80.0–100.0)
Platelets: 237 K/uL (ref 150–400)
RBC: 4.33 MIL/uL (ref 4.22–5.81)
RDW: 13 % (ref 11.5–15.5)
WBC: 5.7 K/uL (ref 4.0–10.5)
nRBC: 0 % (ref 0.0–0.2)

## 2023-10-16 LAB — BASIC METABOLIC PANEL WITH GFR
Anion gap: 14 (ref 5–15)
BUN: 11 mg/dL (ref 8–23)
CO2: 24 mmol/L (ref 22–32)
Calcium: 8.2 mg/dL — ABNORMAL LOW (ref 8.9–10.3)
Chloride: 97 mmol/L — ABNORMAL LOW (ref 98–111)
Creatinine, Ser: 1.13 mg/dL (ref 0.61–1.24)
GFR, Estimated: >60 mL/min (ref >60)
Glucose, Bld: 233 mg/dL — ABNORMAL HIGH (ref 70–99)
Potassium: 3.5 mmol/L (ref 3.5–5.1)
Sodium: 135 mmol/L (ref 135–145)

## 2023-10-16 MED ORDER — LEVOFLOXACIN 750 MG PO TABS: 750.0000 mg | ORAL_TABLET | Freq: Every day | ORAL | 0 refills | Status: AC

## 2023-10-16 MED ORDER — LIDOCAINE 5 % EX PTCH
1.0000 | MEDICATED_PATCH | CUTANEOUS | Status: DC
  Administered 2023-10-16: 1 via TRANSDERMAL
  Filled 2023-10-16: qty 1

## 2023-10-16 NOTE — ED Provider Notes (Signed)
 Section EMERGENCY DEPARTMENT AT [facility] Provider Note   CSN: [id number] Arrival date & time: 10/16/23  1501     Patient presents with: Flank Pain   Robert Duarte is a 68 y.o. male.    Flank Pain  Presents because of right lower back pain/right flank pain.  Patient states that he had a upper story infection about a week ago he been coughing a lot.  Subsequently started noticing worsening pain in his right lower back/right flank area during this time.  Hurts with coughs.  Patient states he also notices it with certain movements.  Also hurts maybe lies down at night in certain areas.  Patient states will lie down and subsequent to have some cramping in this area.  No pleuritic chest pain actually in the lungs or chest.  No hemoptysis.  Does endorse a history of PE but states that does not feel similar.  No shortness of breath beyond his typical baseline with a given history of COPD.  Recent upper sore infection but has had no fevers.  Just a nonproductive cough.  No exertional chest pain.  No dysuria or hematuria.  Movements been regular.  No diarrhea.  No blood in stool.   Previous medical history reviewed : Last seen in the ED in May 2025.  Presented because of chest pain.  History of PE.  Negative PE study during this visit.  Negative ACS workup.     Prior to Admission medications   Medication Sig Start Date End Date Taking? Authorizing Provider  levofloxacin  (LEVAQUIN ) 750 MG tablet Take 1 tablet (750 mg total) by mouth daily for 5 days. 10/16/23 10/21/23 Yes Simon Lavonia SAILOR, MD  acetaminophen  (TYLENOL ) 325 MG tablet Take 2 tablets (650 mg total) by mouth every 6 (six) hours as needed for fever, moderate pain, mild pain or headache. 10/31/21   Johnson, Clanford L, MD  albuterol  (PROVENTIL  HFA;VENTOLIN  HFA) 108 (90 Base) MCG/ACT inhaler Inhale 2 puffs into the lungs every 4 (four) hours as needed for wheezing or shortness of breath. 03/06/18   Cleotilde Rogue, MD  albuterol   (PROVENTIL ) (2.5 MG/3ML) 0.083% nebulizer solution Take 3 mLs (2.5 mg total) by nebulization every 6 (six) hours as needed for wheezing or shortness of breath. 04/03/22   Raford Lenis, MD  albuterol  (PROVENTIL ) 4 MG tablet Take 1 tablet by mouth 3 (three) times daily. 10/28/21   [provider]  atorvastatin (LIPITOR) 20 MG tablet Take 20 mg by mouth daily. 07/14/23   [provider]  Budeson-Glycopyrrol-Formoterol  (BREZTRI  AEROSPHERE) 160-9-4.8 MCG/ACT AERO Inhale 1 Dose into the lungs daily.    [provider]  ezetimibe (ZETIA) 10 MG tablet Take 10 mg by mouth daily. 09/03/22   [provider]  pantoprazole  (PROTONIX ) 40 MG tablet Take 40 mg by mouth 2 (two) times daily. 10/08/21   [provider]    Allergies: Penicillins and Yellow jacket venom [bee venom]    Review of Systems  Genitourinary:  Positive for flank pain.    Updated Vital Signs BP 133/77   Pulse 72   Temp 98.4 F (36.9 C)   Resp 18   Ht 5' 8 (1.727 m)   Wt 53.5 kg   SpO2 96%   BMI 17.94 kg/m   Physical Exam Vitals and nursing note reviewed.  Constitutional:      General: He is not in acute distress.    Appearance: He is well-developed.  HENT:     Head: Normocephalic and atraumatic.  Eyes:     Conjunctiva/sclera: Conjunctivae normal.  Cardiovascular:     Rate and Rhythm: Normal rate and regular rhythm.     Heart sounds: No murmur heard. Pulmonary:     Effort: Pulmonary effort is normal. No respiratory distress.     Breath sounds: Normal breath sounds.  Abdominal:     Palpations: Abdomen is soft.     Tenderness: There is no abdominal tenderness.  Musculoskeletal:        General: No swelling.     Cervical back: Neck supple.       Back:  Skin:    General: Skin is warm and dry.     Capillary Refill: Capillary refill takes less than 2 seconds.  Neurological:     Mental Status: He is alert.  Psychiatric:        Mood and Affect: Mood normal.     (all labs  ordered are listed, but only abnormal results are displayed) Labs Reviewed  URINALYSIS, ROUTINE W REFLEX MICROSCOPIC - Abnormal; Notable for the following components:      Result Value   Glucose, UA 50 (*)    All other components within normal limits  BASIC METABOLIC PANEL WITH GFR - Abnormal; Notable for the following components:   Chloride 97 (*)    Glucose, Bld 233 (*)    Calcium  8.2 (*)    All other components within normal limits  CBC    EKG: EKG Interpretation Date/Time:  Friday October 16 2023 18:03:54 EDT Ventricular Rate:  71 PR Interval:  164 QRS Duration:  96 QT Interval:  420 QTC Calculation: 457 R Axis:   81  Text Interpretation: Sinus rhythm Consider left atrial enlargement Borderline right axis deviation Nonspecific T abnrm, anterolateral leads Confirmed by Simon Rea [phone]) on 10/16/2023 6:28:29 PM  Radiology: DG Chest Portable 1 View Addendum Date: 10/16/2023 ADDENDUM REPORT: 10/16/2023 16:16 ADDENDUM: As correlated with same day abdominopelvic CT, mild patchy airspace disease is visible at the right lung base, suspicious for pneumonia based on the CT appearance. Followup PA and lateral chest X-ray is recommended in 4-6 weeks following appropriate therapy to ensure resolution and exclude underlying malignancy. Electronically Signed   By: Elsie Perone M.D.   On: 10/16/2023 16:16   Result Date: 10/16/2023 CLINICAL DATA:  Cough.  History of COPD.  Evaluate for pneumonia. EXAM: PORTABLE CHEST 1 VIEW COMPARISON:  Radiographs 05/30/2023 and 06/23/2022.  CT 05/30/2023. FINDINGS: 1540 hours. Two views submitted. The heart size and mediastinal contours are stable. Stable chronic emphysematous changes with interstitial prominence in both lungs and asymmetric right apical scarring. No confluent airspace disease, pleural effusion or pneumothorax. The bones appear unremarkable. IMPRESSION: Stable chronic emphysematous changes. No evidence of acute cardiopulmonary process.  Electronically Signed: By: Elsie Perone M.D. On: 10/16/2023 16:08   CT Renal Stone Study Result Date: 10/16/2023 CLINICAL DATA:  Low back and right flank pain. History of kidney stones. EXAM: CT ABDOMEN AND PELVIS WITHOUT CONTRAST TECHNIQUE: Multidetector CT imaging of the abdomen and pelvis was performed following the standard protocol without IV contrast. RADIATION DOSE REDUCTION: This exam was performed according to the departmental dose-optimization program which includes automated exposure control, adjustment of the mA and/or kV according to patient size and/or use of iterative reconstruction technique. COMPARISON:  Abdominal CT 10/26/2009.  Chest CTA 05/30/2023. FINDINGS: Lower chest: There is dependent consolidation in the right lower lobe which is new compared with previous chest CT, suspicious for pneumonia. There is also mild patchy airspace disease within  the right middle and left lower lobes. No significant pleural or pericardial effusion. Hepatobiliary: The liver has a non cirrhotic morphology and demonstrates no suspicious focal abnormality on noncontrast imaging. There is a probable 9 mm cyst in the inferior right lobe on image 23/2. No evidence of gallstones, gallbladder wall thickening or biliary dilatation. Pancreas: Unremarkable. No pancreatic ductal dilatation or surrounding inflammatory changes. Spleen: Stable splenic calcifications medially. No splenomegaly or suspicious focal abnormality. Adrenals/Urinary Tract: Both adrenal glands appear normal. Evidence of mild medullary nephrocalcinosis bilaterally. No discrete renal or ureteral calculi. No hydronephrosis or perinephric soft tissue stranding. The bladder appears unremarkable for its degree of distention. Stomach/Bowel: No enteric contrast administered. The stomach appears unremarkable for its degree of distension. No evidence of bowel wall thickening, distention or surrounding inflammatory change. The appendix appears normal. Mildly  prominent stool throughout the colon. Vascular/Lymphatic: There are no enlarged abdominal or pelvic lymph nodes. Aortic and branch vessel atherosclerosis without evidence of aneurysm. Reproductive: The prostate gland and seminal vesicles appear unremarkable. Other: No evidence of abdominal wall mass or hernia. No ascites or pneumoperitoneum. Musculoskeletal: No acute osseous findings. Degenerative disc disease at L5-S1. Left femoral head osteonecrosis without subchondral collapse. IMPRESSION: 1. No acute findings or explanation for the patient's symptoms. 2. Mild medullary nephrocalcinosis without evidence of ureteral calculus or hydronephrosis. 3. New dependent consolidation in the right lower lobe suspicious for pneumonia. There is also mild patchy airspace disease in the right middle and left lower lobes. 4. Left femoral head osteonecrosis without subchondral collapse. 5.  Aortic Atherosclerosis (ICD10-I70.0). Electronically Signed   By: Elsie Perone M.D.   On: 10/16/2023 16:15     Procedures   Medications Ordered in the ED  lidocaine  (LIDODERM ) 5 % 1 patch (1 patch Transdermal Patch Applied 10/16/23 1538)                                    Medical Decision Making Amount and/or Complexity of Data Reviewed Labs: ordered. Radiology: ordered.  Risk Prescription drug management.    Presents because of right lower back pain/right flank pain.  Patient states that he had a upper story infection about a week ago he been coughing a lot.  Subsequently started noticing worsening pain in his right lower back/right flank area during this time.  Hurts with coughs.  Patient states he also notices it with certain movements.  Also hurts maybe lies down at night in certain areas.  Patient states will lie down and subsequent to have some cramping in this area.  No pleuritic chest pain actually in the lungs or chest.  No hemoptysis.  Does endorse a history of PE but states that does not feel similar.  No  shortness of breath beyond his typical baseline with a given history of COPD.  Recent upper sore infection but has had no fevers.  Just a nonproductive cough.  No exertional chest pain.  No dysuria or hematuria.  Movements been regular.  No diarrhea.  No blood in stool.   Previous medical history reviewed : Last seen in the ED in May 2025.  Presented because of chest pain.  History of PE.  Negative PE study during this visit.  Negative ACS workup.     Upon exam, patient hemodynamically stable.  On room air, 96%.  No tachypnea tachycardia.  Patient does have flank pain.  More sacroiliac pain.  Reproducible.  No pain to palpation of  the right lower quadrant to be concern for appendicitis.  Questionable CVA but seems to be lower than CVA.   Patient does have a history of PE.  Currently, no concerns for PE.  No pleuritic chest pain hemoptysis.  He does endorse pain in the right lower back when taking a deep breath but no pain in this actual lung area.  Seems more musculoskeletal.  No tachypnea or tachycardia.  No hypoxia.  Pain in his back is more SI joint.  More consistent for MSK.  No concerns for any kind of recurrent PE given location of pain.  Far too low.  Did obtain chest x-ray given recent viral illness.  Does have a history of COPD.  X-ray concerning for possible pneumonia on the right side.  Therefore, we will start patient on oral antibiotics.  Cannot start patient on penicillins given penicillin allergy.  Started patient on levofloxacin .  QTc normal on EKG.   Did obtain CT scan to assessment, evidence of obstructive kidney stone in right ureter given location of pain and history of kidney stones.  CT scan did not show any evidence of acute stone in ureter.  Does have some calcifications around the kidney that he can follow-up with his urologist with.  Discussed this with the patient.  UA unremarkable.  No concerns for pyelonephritis.  No UTI.  Patient intermittently did have evidence of  avascular necrosis of the left hip.  He is asymptomatic from this point view.  Does have a history of COPD.  Has been on steroids in the past which places patient at high risk.  Discussed with orthopedic surgery Dr. Onesimo.  Will follow-up with them outpatient.  Again asymptomatic from that standpoint.  He has 2+ dorsal pedal and posterior tibial pulses bilaterally.   Plan to the patient that he will need repeat chest x-ray in 4 to 6 weeks after treatment to make sure there is no ongoing infiltrate in the right lung.  Need to rule out any kind of cancer given smoking history.        Final diagnoses:  Acute right-sided low back pain without sciatica  Avascular necrosis (HCC)  Pneumonia of right lung due to infectious organism, unspecified part of lung    ED Discharge Orders          Ordered    levofloxacin  (LEVAQUIN ) 750 MG tablet  Daily        10/16/23 1732               Simon Lavonia SAILOR, MD 10/16/23 [phone]

## 2023-10-16 NOTE — Discharge Instructions (Addendum)
 You do have some changes to your left hip. Called avascular necrosis. Please follow up with orthopedic surgery for this. Give them a call at the number given.   You do have some calcifications of your kidney. Please discuss this with your urologist.   We are giving you antibiotics for concern for pneumonia.  Please take as prescribed.  Please have follow-up x-ray in 4 to 6 weeks to make sure this resolves and make sure this is not actually underlying cancer.  Looks more like a pneumonia type process but given your smoking history, this needs to be followed up on by your PCP.  For pain, you can take 1000 mg of Tylenol  or 1 g of Tylenol  every 6-8 hours.  Do not exceed more than 4000 mg or 4 g in a 24-hour period.    You can also try lidocaine  patches.  This is purchased over-the-counter.  If you start have any kind of chest pain or shortness of breath or fever or chills then please go back to the ED.

## 2023-10-16 NOTE — ED Notes (Signed)
 Pt ambulated to restroom.

## 2023-10-16 NOTE — ED Triage Notes (Signed)
 Pt stated that he is having right lower back pain that feels like he may have a kidney stone. Hx of stones in the past.

## 2023-10-21 ENCOUNTER — Ambulatory Visit (INDEPENDENT_AMBULATORY_CARE_PROVIDER_SITE_OTHER): Admitting: Gastroenterology

## 2023-10-21 ENCOUNTER — Encounter (INDEPENDENT_AMBULATORY_CARE_PROVIDER_SITE_OTHER): Payer: Self-pay | Admitting: Gastroenterology

## 2023-10-21 VITALS — BP 105/70 | HR 83 | Temp 97.1°F | Ht 68.0 in | Wt 116.0 lb

## 2023-10-21 DIAGNOSIS — A048 Other specified bacterial intestinal infections: Secondary | ICD-10-CM

## 2023-10-21 DIAGNOSIS — R1013 Epigastric pain: Secondary | ICD-10-CM | POA: Diagnosis not present

## 2023-10-21 DIAGNOSIS — Z860102 Personal history of hyperplastic colon polyps: Secondary | ICD-10-CM | POA: Diagnosis not present

## 2023-10-21 DIAGNOSIS — R1310 Dysphagia, unspecified: Secondary | ICD-10-CM

## 2023-10-21 DIAGNOSIS — K219 Gastro-esophageal reflux disease without esophagitis: Secondary | ICD-10-CM

## 2023-10-21 DIAGNOSIS — Z1211 Encounter for screening for malignant neoplasm of colon: Secondary | ICD-10-CM | POA: Insufficient documentation

## 2023-10-21 DIAGNOSIS — R1319 Other dysphagia: Secondary | ICD-10-CM | POA: Insufficient documentation

## 2023-10-21 NOTE — Patient Instructions (Signed)
 It was very nice to meet you today, as dicussed with will plan for the following :  1)Avoid using high dose aspirin including Goody/BC powders, NSAIDs such as Aleve, ibuprofen , naproxen, Motrin , Voltaren or Advil  (even the topical ones)  2)Upper endoscopy and colonoscopy

## 2023-10-21 NOTE — Progress Notes (Signed)
 Gregorio Worley Faizan Gopal Malter , M.D. Gastroenterology & Hepatology [facility] Gastroenterology [address] [place], [place] [address] Primary Care Physician: Edman Meade PEDLAR, FNP [address] [place] [address]  Chief Complaint: Dysphagia, epigastric pain, colon cancer screening  History of Present Illness: Robert Duarte is a 68 y.o. male history of PE previously on warfarin, COPD, H. pylori status posttreatment (eradication not confirmed who presents for evaluation of solid food dysphagia, epigastric pain and colon cancer screening  Patient has history of chronic dysphagia where he had an upper endoscopy in 2012 does Schatzki's ring and has done well since then.  Recently started developing again solid food dysphagia where he has to vomit occasionally and food getting stuck in middle of his chest.  Patient also has epigastric pain postprandially.  Takes NSAIDs intermittently The patient denies having any nausea, vomiting, fever, chills, hematochezia, melena, hematemesis, abdominal distention, diarrhea, jaundice, pruritus or weight loss.  Labs from 08/29/2023 creatinine 1.13 hemoglobin 13  Last EGD:2012  EGD FINDINGS:  Examination of the tubular esophagus revealed a noncritical-appearing Schatzki's ring and tiny circumferential esophageal erosions straddling the GE junction.  There was no Barrett's esophagus or tumor.  EG junction was easily traversed.A 54- French Maloney dilator was passed to full insertion with ease.  Last Colonoscopy:2012  COLONOSCOPY FINDINGS:  Rectal prolapse as described above.  A single diminutive rectosigmoid polyp status post cold biopsy removal, otherwise remainder of the rectum, colon, terminal ileum appeared to be normal.   HYPERPLASTIC POLYP(S). NO ADENOMATOUS CHANGE OR MALIGNANCY IDENTIFIED.  RECOMMENDATIONS: . A 10-day course of Canasa, 1 g mesalamine suppositories one per     rectum at bedtime.  FHx: neg for any  gastrointestinal/liver disease, no malignancies  Past Medical History: Past Medical History:  Diagnosis Date   Arthritis    CHF (congestive heart failure) (HCC)    Emphysema     Past Surgical History: Past Surgical History:  Procedure Laterality Date   COLONOSCOPY  01/21/2000   internal hemorrhoids   TONSILLECTOMY     UPPER GASTROINTESTINAL ENDOSCOPY      Family History: Family History  Problem Relation Age of Onset   Lung cancer Father        deceased   Heart disease Mother        heart valve replacement   Colon cancer Neg Hx    Liver disease Neg Hx     Social History: Social History   Tobacco Use  Smoking Status Former   Current packs/day: 0.00   Average packs/day: 1 pack/day for 47.5 years (47.5 ttl pk-yrs)   Types: Cigarettes   Start date: 03/09/1971   Quit date: 09/21/2018   Years since quitting: 5.0  Smokeless Tobacco Never   Social History   Substance and Sexual Activity  Alcohol Use No   Social History   Substance and Sexual Activity  Drug Use No    Allergies: Allergies  Allergen Reactions   Penicillins Other (See Comments)    Patient passes out   Yellow Jacket Venom [Bee Venom] Swelling    Medications: Current Outpatient Medications  Medication Sig Dispense Refill   acetaminophen  (TYLENOL ) 325 MG tablet Take 2 tablets (650 mg total) by mouth every 6 (six) hours as needed for fever, moderate pain, mild pain or headache.     albuterol  (PROVENTIL  HFA;VENTOLIN  HFA) 108 (90 Base) MCG/ACT inhaler Inhale 2 puffs into the lungs every 4 (four) hours as needed for wheezing or shortness of breath. 1 Inhaler  3   albuterol  (PROVENTIL ) (2.5 MG/3ML) 0.083% nebulizer solution Take 3 mLs (2.5 mg total) by nebulization every 6 (six) hours as needed for wheezing or shortness of breath. 75 mL 12   albuterol  (PROVENTIL ) 4 MG tablet Take 1 tablet by mouth 3 (three) times daily.     atorvastatin (LIPITOR) 20 MG tablet Take 20 mg by mouth daily.      Budeson-Glycopyrrol-Formoterol  (BREZTRI  AEROSPHERE) 160-9-4.8 MCG/ACT AERO Inhale 1 Dose into the lungs daily.     ezetimibe (ZETIA) 10 MG tablet Take 10 mg by mouth daily.     levofloxacin  (LEVAQUIN ) 750 MG tablet Take 1 tablet (750 mg total) by mouth daily for 5 days. 5 tablet 0   pantoprazole  (PROTONIX ) 40 MG tablet Take 40 mg by mouth 2 (two) times daily.     No current facility-administered medications for this visit.    Review of Systems: GENERAL: negative for malaise, night sweats HEENT: No changes in hearing or vision, no nose bleeds or other nasal problems. NECK: Negative for lumps, goiter, pain and significant neck swelling RESPIRATORY: Negative for cough, wheezing CARDIOVASCULAR: Negative for chest pain, leg swelling, palpitations, orthopnea GI: SEE HPI MUSCULOSKELETAL: Negative for joint pain or swelling, back pain, and muscle pain. SKIN: Negative for lesions, rash HEMATOLOGY Negative for prolonged bleeding, bruising easily, and swollen nodes. ENDOCRINE: Negative for cold or heat intolerance, polyuria, polydipsia and goiter. NEURO: negative for tremor, gait imbalance, syncope and seizures. The remainder of the review of systems is noncontributory.   Physical Exam: BP 105/70   Pulse 83   Temp (!) 97.1 F (36.2 C)   Ht 5' 8 (1.727 m)   Wt 116 lb (52.6 kg)   BMI 17.64 kg/m  GENERAL: The patient is AO x3, in no acute distress. HEENT: Head is normocephalic and atraumatic. EOMI are intact. Mouth is well hydrated and without lesions. NECK: Supple. No masses LUNGS: Clear to auscultation. No presence of rhonchi/wheezing/rales. Adequate chest expansion HEART: RRR, normal s1 and s2. ABDOMEN: Soft, nontender, no guarding, no peritoneal signs, and nondistended. BS +. No masses.   Imaging/Labs: as above     Latest Ref Rng & Units 10/16/2023    3:34 PM 05/30/2023    5:31 PM 06/23/2022    8:04 PM  CBC  WBC 4.0 - 10.5 K/uL 5.7  10.3  5.1   Hemoglobin 13.0 - 17.0 g/dL 86.9   85.9  86.2   Hematocrit 39.0 - 52.0 % 39.5  41.3  41.4   Platelets 150 - 400 K/uL 237  168  220    No results found for: IRON, TIBC, FERRITIN  I personally reviewed and interpreted the available labs, imaging and endoscopic files.  Ct 09/2023  IMPRESSION: 1. No acute findings or explanation for the patient's symptoms. 2. Mild medullary nephrocalcinosis without evidence of ureteral calculus or hydronephrosis. 3. New dependent consolidation in the right lower lobe suspicious for pneumonia. There is also mild patchy airspace disease in the right middle and left lower lobes. 4. Left femoral head osteonecrosis without subchondral collapse. 5.  Aortic Atherosclerosis (ICD10-I70.0).   Impression and Plan:  Robert Duarte is a 68 y.o. male history of PE previously on warfarin, COPD, H. pylori status posttreatment (eradication not confirmed who presents for evaluation of solid food dysphagia, epigastric pain and colon cancer screening  # Solid food dysphagia  Patient has history of Schatzki's ring, previous dilation in 2012.  This could be GERD, esophagitis, web, ring, stricture but need to rule out malignancy  as patient is a chronic smoker  Epigastric pain could be peptic ulcer disease given NSAID exposure or could be referred pain from GERD  Continue PPI once daily Upper endoscopy with dilation/biopsies.  Will also obtain gastric biopsy to confirm eradication remaining of H. pylori at that time  #Colon cancer screening   Last colonoscopy 2012  The patient was counseled regarding the importance of colorectal cancer screening, . The benefits of screening include early detection of colorectal cancer and precancerous polyps, which can improve treatment outcomes and reduce mortality. Risks associated with screening, particularly colonoscopy, include potential complications such as bleeding and perforation. After deciding different modalities for screening for colon cancer , patient  has opted to pursue Colonoscopy   I thoroughly discussed with the patient the procedure, including the risks involved. Patient understands what the procedure involves including the benefits and any risks. Patient understands alternatives to the proposed procedure. Risks including (but not limited to) bleeding, tearing of the lining (perforation), rupture of adjacent organs, problems with heart and lung function, infection, and medication reactions. A small percentage of complications may require surgery, hospitalization, repeat endoscopic procedure, and/or transfusion.  Patient understood and agreed.    All questions were answered.      Robert Dority Faizan Jawad Wiacek, MD Gastroenterology and Hepatology [facility] Gastroenterology   This chart has been completed using [facility] Dictation software, and while attempts have been made to ensure accuracy , certain words and phrases may not be transcribed as intended

## 2023-10-27 ENCOUNTER — Telehealth (INDEPENDENT_AMBULATORY_CARE_PROVIDER_SITE_OTHER): Payer: Self-pay

## 2023-10-27 MED ORDER — PEG 3350-KCL-NA BICARB-NACL 420 G PO SOLR: 4000.0000 mL | Freq: Once | ORAL | 0 refills | Status: AC

## 2023-10-27 NOTE — Telephone Encounter (Signed)
 Spoke with patient, scheduled TCS/EGD/DIL for 11/24/2023 at 10:00am. Rx sent to pharmacy. Instructions sent to mychart.

## 2023-10-27 NOTE — Telephone Encounter (Signed)
 PA on [facility] for TCS/EGD/DIL : Notification or Prior Authorization is not required for the requested services You are not required to submit a notification/prior authorization based on the information provided.  Decision ID #: [id number]

## 2023-10-29 NOTE — Telephone Encounter (Signed)
 ATC pt, no answer. LVM with pre-op telephone appt details.

## 2023-11-19 ENCOUNTER — Encounter (HOSPITAL_COMMUNITY): Payer: Self-pay

## 2023-11-19 ENCOUNTER — Encounter (HOSPITAL_COMMUNITY)
Admission: RE | Admit: 2023-11-19 | Discharge: 2023-11-19 | Disposition: A | Discharge status: Home / Self Care | Source: Ambulatory Visit | Attending: Gastroenterology | Admitting: Gastroenterology

## 2023-11-24 ENCOUNTER — Encounter (INDEPENDENT_AMBULATORY_CARE_PROVIDER_SITE_OTHER): Payer: Self-pay | Admitting: *Deleted

## 2023-11-24 ENCOUNTER — Ambulatory Visit (HOSPITAL_COMMUNITY): Admitting: Anesthesiology

## 2023-11-24 ENCOUNTER — Encounter (HOSPITAL_COMMUNITY)
Admission: RE | Disposition: A | Discharge status: Home / Self Care | Payer: Self-pay | Source: Home / Self Care | Attending: Gastroenterology

## 2023-11-24 ENCOUNTER — Ambulatory Visit (HOSPITAL_COMMUNITY)
Admission: RE | Admit: 2023-11-24 | Discharge: 2023-11-24 | Disposition: A | Discharge status: Home / Self Care | Attending: Gastroenterology | Admitting: Gastroenterology

## 2023-11-24 DIAGNOSIS — R1310 Dysphagia, unspecified: Secondary | ICD-10-CM | POA: Diagnosis not present

## 2023-11-24 DIAGNOSIS — K648 Other hemorrhoids: Secondary | ICD-10-CM

## 2023-11-24 DIAGNOSIS — K2970 Gastritis, unspecified, without bleeding: Secondary | ICD-10-CM | POA: Diagnosis not present

## 2023-11-24 DIAGNOSIS — Z1211 Encounter for screening for malignant neoplasm of colon: Secondary | ICD-10-CM

## 2023-11-24 DIAGNOSIS — D122 Benign neoplasm of ascending colon: Secondary | ICD-10-CM

## 2023-11-24 DIAGNOSIS — I509 Heart failure, unspecified: Secondary | ICD-10-CM

## 2023-11-24 DIAGNOSIS — K644 Residual hemorrhoidal skin tags: Secondary | ICD-10-CM

## 2023-11-24 DIAGNOSIS — K449 Diaphragmatic hernia without obstruction or gangrene: Secondary | ICD-10-CM | POA: Diagnosis not present

## 2023-11-24 DIAGNOSIS — K222 Esophageal obstruction: Secondary | ICD-10-CM

## 2023-11-24 DIAGNOSIS — K200 Eosinophilic esophagitis: Secondary | ICD-10-CM | POA: Insufficient documentation

## 2023-11-24 HISTORY — PX: ESOPHAGOGASTRODUODENOSCOPY: SHX5428

## 2023-11-24 HISTORY — PX: COLONOSCOPY: SHX5424

## 2023-11-24 HISTORY — PX: ESOPHAGEAL DILATION: SHX303

## 2023-11-24 LAB — HM COLONOSCOPY

## 2023-11-24 LAB — GLUCOSE, CAPILLARY: Glucose-Capillary: 90 mg/dL (ref 70–99)

## 2023-11-24 SURGERY — COLONOSCOPY
Anesthesia: General

## 2023-11-24 MED ORDER — LACTATED RINGERS IV SOLN: INTRAVENOUS | Status: DC | PRN

## 2023-11-24 MED ORDER — PROPOFOL 10 MG/ML IV BOLUS
INTRAVENOUS | Status: DC | PRN
Start: 2023-11-24 — End: 2023-11-24
  Administered 2023-11-24: 50 mg via INTRAVENOUS

## 2023-11-24 MED ORDER — PROPOFOL 500 MG/50ML IV EMUL
INTRAVENOUS | Status: DC | PRN
Start: 2023-11-24 — End: 2023-11-24
  Administered 2023-11-24: 125 ug/kg/min via INTRAVENOUS

## 2023-11-24 MED ORDER — LIDOCAINE 2% (20 MG/ML) 5 ML SYRINGE
INTRAMUSCULAR | Status: DC | PRN
Start: 2023-11-24 — End: 2023-11-24
  Administered 2023-11-24: 80 mg via INTRAVENOUS

## 2023-11-24 NOTE — Anesthesia Preprocedure Evaluation (Addendum)
 Anesthesia Evaluation  Patient identified by MRN, date of birth, ID band Patient awake    Reviewed: Allergy & Precautions, H&P , NPO status , Patient's Chart, lab work & pertinent test results, reviewed documented beta blocker date and time   Airway Mallampati: II  TM Distance: >3 FB Neck ROM: full    Dental  (+) Upper Dentures,    Pulmonary COPD, former smoker, PE   Pulmonary exam normal breath sounds clear to auscultation       Cardiovascular +CHF  Normal cardiovascular exam Rhythm:regular Rate:Normal  EF 55%. Grade 1 diastolic dysfunction   Neuro/Psych negative neurological ROS  negative psych ROS   GI/Hepatic negative GI ROS, Neg liver ROS,GERD  ,,  Endo/Other  negative endocrine ROS    Renal/GU negative Renal ROS  negative genitourinary   Musculoskeletal  (+) Arthritis , Osteoarthritis,    Abdominal   Peds  Hematology negative hematology ROS (+)   Anesthesia Other Findings   Reproductive/Obstetrics negative OB ROS                              Anesthesia Physical Anesthesia Plan  ASA: 3  Anesthesia Plan: General   Post-op Pain Management: Minimal or no pain anticipated   Induction:   PONV Risk Score and Plan: Propofol infusion  Airway Management Planned: Natural Airway and Nasal Cannula  Additional Equipment: None  Intra-op Plan:   Post-operative Plan:   Informed Consent: I have reviewed the patients History and Physical, chart, labs and discussed the procedure including the risks, benefits and alternatives for the proposed anesthesia with the patient or authorized representative who has indicated his/her understanding and acceptance.     Dental Advisory Given  Plan Discussed with: CRNA  Anesthesia Plan Comments:          Anesthesia Quick Evaluation

## 2023-11-24 NOTE — Transfer of Care (Signed)
 Immediate Anesthesia Transfer of Care Note  Patient: Robert Duarte  Procedure(s) Performed: COLONOSCOPY EGD (ESOPHAGOGASTRODUODENOSCOPY) DILATION, ESOPHAGUS  Patient Location: PACU  Anesthesia Type:MAC  Level of Consciousness: sleepy   Airway & Oxygen  Therapy: Patient Spontanous Breathing and Patient connected to nasal cannula oxygen   Post-op Assessment: Report given to RN and Post -op Vital signs reviewed and stable  Post vital signs: Reviewed and stable  Last Vitals:  Vitals Value Taken Time  BP    Temp    Pulse    Resp    SpO2      Last Pain:  Vitals:   11/24/23 1021  TempSrc:   PainSc: 0-No pain      Patients Stated Pain Goal: 6 (11/24/23 0914)  Complications: No notable events documented.

## 2023-11-24 NOTE — Op Note (Signed)
 [facility] Patient Name: Robert Duarte Procedure Date: 11/24/2023 10:02 AM MRN: [id number] Date of Birth: March 01, 1955 Attending MD: Deatrice Dine , MD, [id number] CSN: [id number] Age: 68 Admit Type: Outpatient Procedure:                Colonoscopy Indications:              Screening for colorectal malignant neoplasm Providers:                Deatrice Dine, MD, Harlene Lips, Crystal Page Referring MD:              Medicines:                Monitored Anesthesia Care Complications:            No immediate complications. Estimated Blood Loss:     Estimated blood loss: none. Procedure:                Pre-Anesthesia Assessment:                           - Prior to the procedure, a History and Physical                            was performed, and patient medications and                            allergies were reviewed. The patient's tolerance of                            previous anesthesia was also reviewed. The risks                            and benefits of the procedure and the sedation                            options and risks were discussed with the patient.                            All questions were answered, and informed consent                            was obtained. Prior Anticoagulants: The patient has                            taken no anticoagulant or antiplatelet agents. ASA                            Grade Assessment: II - A patient with mild systemic                            disease. After reviewing the risks and benefits,                            the patient was deemed in satisfactory condition to  undergo the procedure.                           After obtaining informed consent, the colonoscope                            was passed under direct vision. Throughout the                            procedure, the patient's blood pressure, pulse, and                            oxygen  saturations were monitored continuously.  The                            [id number] ([id number]) Colon was introduced through                            the anus and advanced to the the cecum, identified                            by appendiceal orifice and ileocecal valve. The                            ileocecal valve, appendiceal orifice, and rectum                            were photographed. Scope In: 10:48:09 AM Scope Out: 11:02:55 AM Scope Withdrawal Time: 0 hours 10 minutes 50 seconds  Total Procedure Duration: 0 hours 14 minutes 46 seconds  Findings:      A 5 mm polyp was found in the ascending colon. The polyp was sessile.       The polyp was removed with a cold snare. Resection and retrieval were       complete.      Non-bleeding external and internal hemorrhoids were found during       retroflexion. The hemorrhoids were medium-sized. Impression:               - One 5 mm polyp in the ascending colon, removed                            with a cold snare. Resected and retrieved.                           - Non-bleeding external and internal hemorrhoids. Moderate Sedation:      Per Anesthesia Care Recommendation:           - Patient has a contact number available for                            emergencies. The signs and symptoms of potential                            delayed complications were discussed with the  patient. Return to normal activities tomorrow.                            Written discharge instructions were provided to the                            patient.                           - Resume previous diet.                           - Continue present medications.                           - Await pathology results.                           - Repeat colonoscopy in 7-10 years for surveillance                            based on pathology results. Procedure Code(s):        --- Professional ---                           [phone], Colonoscopy, flexible; with removal of                             tumor(s), polyp(s), or other lesion(s) by snare                            technique Diagnosis Code(s):        --- Professional ---                           Z12.11, Encounter for screening for malignant                            neoplasm of colon                           D12.2, Benign neoplasm of ascending colon                           K64.8, Other hemorrhoids CPT copyright 2022 American Medical Association. All rights reserved. The codes documented in this report are preliminary and upon coder review may  be revised to meet current compliance requirements. Deatrice Dine, MD Deatrice Dine, MD 11/24/2023 11:14:05 AM This report has been signed electronically. Number of Addenda: 0

## 2023-11-24 NOTE — Anesthesia Postprocedure Evaluation (Signed)
 Anesthesia Post Note  Patient: Robert Duarte  Procedure(s) Performed: COLONOSCOPY EGD (ESOPHAGOGASTRODUODENOSCOPY) DILATION, ESOPHAGUS  Patient location during evaluation: Phase II Anesthesia Type: General Level of consciousness: awake and alert Pain management: pain level controlled Vital Signs Assessment: post-procedure vital signs reviewed and stable Respiratory status: spontaneous breathing, nonlabored ventilation and respiratory function stable Cardiovascular status: stable Anesthetic complications: no   There were no known notable events for this encounter.   Last Vitals:  Vitals:   11/24/23 1112 11/24/23 1116  BP: (!) 98/51 109/67  Pulse: 70   Resp: 13   Temp: (!) 35.7 C (!) 35.8 C  SpO2: 100% 100%    Last Pain:  Vitals:   11/24/23 1116  TempSrc: Axillary  PainSc:                  Robert Duarte

## 2023-11-24 NOTE — Op Note (Signed)
 [facility] Patient Name: Robert Duarte Procedure Date: 11/24/2023 10:05 AM MRN: [id number] Date of Birth: [birth year] Attending MD: Deatrice Dine , MD, [id number] CSN: [id number] Age: 68 Admit Type: Outpatient Procedure:                Upper GI endoscopy Indications:              Dysphagia Providers:                Deatrice Dine, MD, Harlene Lips, Crystal Page Referring MD:              Medicines:                 Complications:            No immediate complications. Estimated blood loss:                            None. Estimated Blood Loss:     Estimated blood loss was minimal. Procedure:                Pre-Anesthesia Assessment:                           - Prior to the procedure, a History and Physical                            was performed, and patient medications and                            allergies were reviewed. The patient's tolerance of                            previous anesthesia was also reviewed. The risks                            and benefits of the procedure and the sedation                            options and risks were discussed with the patient.                            All questions were answered, and informed consent                            was obtained. Prior Anticoagulants: The patient has                            taken no anticoagulant or antiplatelet agents. ASA                            Grade Assessment: II - A patient with mild systemic                            disease. After reviewing the risks and benefits,  the patient was deemed in satisfactory condition to                            undergo the procedure.                           After obtaining informed consent, the endoscope was                            passed under direct vision. Throughout the                            procedure, the patient's blood pressure, pulse, and                            oxygen  saturations were monitored  continuously. The                            [id number] ([id number]) Upper was introduced through                            the mouth, and advanced to the second part of                            duodenum. The upper GI endoscopy was accomplished                            without difficulty. The patient tolerated the                            procedure well. Scope In: 10:29:46 AM Scope Out: 10:37:53 AM Total Procedure Duration: 0 hours 8 minutes 7 seconds  Findings:      Esophagogastric landmarks were identified: the Z-line was found at 40 cm       and the site of hiatal narrowing was found at 44 cm from the incisors.      A obstructing Schatzki ring was found at the gastroesophageal junction.       A TTS dilator was passed through the scope. Dilation with an 18-19-20 mm       balloon dilator was performed to 20 mm. The dilation site was examined       following endoscope reinsertion and showed moderate mucosal disruption.       Biopsies were obtained from the proximal and distal esophagus with cold       forceps for histology of suspected eosinophilic esophagitis.      Mild inflammation characterized by erythema was found in the gastric       body. Biopsies were taken with a cold forceps for histology.      A 4 cm hiatal hernia was present.      The duodenal bulb and second portion of the duodenum were normal. Impression:               - Esophagogastric landmarks identified.                           - Obstructing Schatzki ring. Dilated.                           -  Gastritis. Biopsied.                           - 4 cm hiatal hernia.                           - Normal duodenal bulb and second portion of the                            duodenum.                           - Biopsies were taken with a cold forceps for                            evaluation of eosinophilic esophagitis. Moderate Sedation:      Per Anesthesia Care Recommendation:           - Patient has a contact number  available for                            emergencies. The signs and symptoms of potential                            delayed complications were discussed with the                            patient. Return to normal activities tomorrow.                            Written discharge instructions were provided to the                            patient.                           - Resume previous diet.                           - Continue present medications.                           - Await pathology results.                           - Repeat upper endoscopy PRN.                           - Return to GI clinic as previously scheduled.                           -PPI daily Procedure Code(s):        --- Professional ---                           [phone], Esophagogastroduodenoscopy, flexible,  transoral; with transendoscopic balloon dilation of                            esophagus (less than 30 mm diameter)                           [id number], 59, Esophagogastroduodenoscopy, flexible,                            transoral; with biopsy, single or multiple Diagnosis Code(s):        --- Professional ---                           K22.2, Esophageal obstruction                           K29.70, Gastritis, unspecified, without bleeding                           K44.9, Diaphragmatic hernia without obstruction or                            gangrene                           R13.10, Dysphagia, unspecified CPT copyright 2022 American Medical Association. All rights reserved. The codes documented in this report are preliminary and upon coder review may  be revised to meet current compliance requirements. Deatrice Dine, MD Deatrice Dine, MD 11/24/2023 11:10:50 AM This report has been signed electronically. Number of Addenda: 0

## 2023-11-24 NOTE — Discharge Instructions (Signed)
  Discharge instructions Please read the instructions outlined below and refer to this sheet in the next few weeks. These discharge instructions provide you with general information on caring for yourself after you leave the hospital. Your doctor may also give you specific instructions. While your treatment has been planned according to the most current medical practices available, unavoidable complications occasionally occur. If you have any problems or questions after discharge, please call your doctor. ACTIVITY You may resume your regular activity but move at a slower pace for the next 24 hours.  Take frequent rest periods for the next 24 hours.  Walking will help expel (get rid of) the air and reduce the bloated feeling in your abdomen.  No driving for 24 hours (because of the anesthesia (medicine) used during the test).  You may shower.  Do not sign any important legal documents or operate any machinery for 24 hours (because of the anesthesia used during the test).  NUTRITION Drink plenty of fluids.  You may resume your normal diet.  Begin with a light meal and progress to your normal diet.  Avoid alcoholic beverages for 24 hours or as instructed by your caregiver.  MEDICATIONS You may resume your normal medications unless your caregiver tells you otherwise.  WHAT YOU CAN EXPECT TODAY You may experience abdominal discomfort such as a feeling of fullness or "gas" pains.  FOLLOW-UP Your doctor will discuss the results of your test with you.  SEEK IMMEDIATE MEDICAL ATTENTION IF ANY OF THE FOLLOWING OCCUR: Excessive nausea (feeling sick to your stomach) and/or vomiting.  Severe abdominal pain and distention (swelling).  Trouble swallowing.  Temperature over 101 F (37.8 C).  Rectal bleeding or vomiting of blood.     PROTONIX  40mg  , 30 min before breakfast    I hope you have a great rest of your week!   Ritisha Deitrick Faizan Laniyah Rosenwald , M.D.. Gastroenterology and Hepatology [facility]

## 2023-11-24 NOTE — H&P (Signed)
 Primary Care Physician:  Edman Meade PEDLAR, FNP Primary Gastroenterologist:  Dr. Cinderella  Pre-Procedure History & Physical: HPI:  Robert Duarte is a 68 y.o. male history of PE previously on warfarin, COPD, H. pylori status posttreatment (eradication not confirmed who presents for evaluation of solid food dysphagia, epigastric pain and colon cancer screening   Patient has history of chronic dysphagia where he had an upper endoscopy in 2012 does Schatzki's ring and has done well since then.  Recently started developing again solid food dysphagia where he has to vomit occasionally and food getting stuck in middle of his chest.  Patient also has epigastric pain postprandially.  Takes NSAIDs intermittently The patient denies having any nausea, vomiting, fever, chills, hematochezia, melena, hematemesis, abdominal distention, diarrhea, jaundice, pruritus or weight loss.   Labs from 08/29/2023 creatinine 1.13 hemoglobin 13   Last EGD:2012  EGD FINDINGS:  Examination of the tubular esophagus revealed a noncritical-appearing Schatzki's ring and tiny circumferential esophageal erosions straddling the GE junction.  There was no Barrett's esophagus or tumor.  EG junction was easily traversed.A 54- French Maloney dilator was passed to full insertion with ease.   Last Colonoscopy:2012  COLONOSCOPY FINDINGS:  Rectal prolapse as described above.  A single diminutive rectosigmoid polyp status post cold biopsy removal, otherwise remainder of the rectum, colon, terminal ileum appeared to be normal.   HYPERPLASTIC POLYP(S). NO ADENOMATOUS CHANGE OR MALIGNANCY IDENTIFIED.   RECOMMENDATIONS: . A 10-day course of Canasa, 1 g mesalamine suppositories one per     rectum at bedtime.   FHx: neg for any gastrointestinal/liver disease, no malignancies    Past Medical History:  Diagnosis Date   Arthritis    CHF (congestive heart failure) (HCC)    Emphysema     Past Surgical History:  Procedure  Laterality Date   COLONOSCOPY  01/21/2000   internal hemorrhoids   TONSILLECTOMY     UPPER GASTROINTESTINAL ENDOSCOPY      Prior to Admission medications   Medication Sig Start Date End Date Taking? Authorizing Provider  albuterol  (PROVENTIL  HFA;VENTOLIN  HFA) 108 (90 Base) MCG/ACT inhaler Inhale 2 puffs into the lungs every 4 (four) hours as needed for wheezing or shortness of breath. 03/06/18  Yes Cleotilde Rogue, MD  albuterol  (PROVENTIL ) (2.5 MG/3ML) 0.083% nebulizer solution Take 3 mLs (2.5 mg total) by nebulization every 6 (six) hours as needed for wheezing or shortness of breath. 04/03/22  Yes Raford Lenis, MD  albuterol  (PROVENTIL ) 4 MG tablet Take 1 tablet by mouth 3 (three) times daily. 10/28/21  Yes [provider]  atorvastatin (LIPITOR) 20 MG tablet Take 20 mg by mouth daily. 07/14/23  Yes [provider]  ezetimibe (ZETIA) 10 MG tablet Take 10 mg by mouth daily. 09/03/22  Yes [provider]  pantoprazole  (PROTONIX ) 40 MG tablet Take 40 mg by mouth 2 (two) times daily. 10/08/21  Yes [provider]  acetaminophen  (TYLENOL ) 325 MG tablet Take 2 tablets (650 mg total) by mouth every 6 (six) hours as needed for fever, moderate pain, mild pain or headache. 10/31/21   Johnson, Clanford L, MD  Budeson-Glycopyrrol-Formoterol  (BREZTRI  AEROSPHERE) 160-9-4.8 MCG/ACT AERO Inhale 1 Dose into the lungs daily.    [provider]    Allergies as of 10/27/2023 - Review Complete 10/21/2023  Allergen Reaction Noted   Penicillins Other (See Comments) 04/25/2010   Yellow jacket venom [bee venom] Swelling 02/02/2018    Family History  Problem Relation Age of Onset   Lung cancer Father  deceased   Heart disease Mother        heart valve replacement   Colon cancer Neg Hx    Liver disease Neg Hx     Social History   Socioeconomic History   Marital status: Married    Spouse name: Not on file   Number of children: Not on file   Years of  education: Not on file   Highest education level: Not on file  Occupational History   Occupation: [employer]    Employer: [employer]    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  Tobacco Use   Smoking status: Former    Current packs/day: 0.00    Average packs/day: 1 pack/day for 47.5 years (47.5 ttl pk-yrs)    Types: Cigarettes    Start date: 03/09/1971    Quit date: 09/21/2018    Years since quitting: 5.1   Smokeless tobacco: Never  Substance and Sexual Activity   Alcohol use: No   Drug use: No   Sexual activity: Not Currently  Other Topics Concern   Not on file  Social History Narrative   Not on file   Social Drivers of Health   Financial Resource Strain: Not on file  Food Insecurity: Not on file  Transportation Needs: Not on file  Physical Activity: Not on file  Stress: Not on file  Social Connections: Not on file  Intimate Partner Violence: Not on file    Review of Systems: See HPI, otherwise negative ROS  Physical Exam: Vital signs in last 24 hours: Temp:  [97.6 F (36.4 C)] 97.6 F (36.4 C) (11/04 0914) Pulse Rate:  [83] 83 (11/04 0914) Resp:  [15] 15 (11/04 0914) BP: (124)/(74) 124/74 (11/04 0914) SpO2:  [98 %] 98 % (11/04 0914) Weight:  [53.5 kg] 53.5 kg (11/04 0914)   General:   Alert,  Well-developed, well-nourished, pleasant and cooperative in NAD Head:  Normocephalic and atraumatic. Eyes:  Sclera clear, no icterus.   Conjunctiva pink. Ears:  Normal auditory acuity. Nose:  No deformity, discharge,  or lesions. Msk:  Symmetrical without gross deformities. Normal posture. Extremities:  Without clubbing or edema. Neurologic:  Alert and  oriented x4;  grossly normal neurologically. Skin:  Intact without significant lesions or rashes. Psych:  Alert and cooperative. Normal mood and affect.  Impression/Plan: [name] is a 68 y.o. male history of PE previously on warfarin, COPD, H. pylori status posttreatment (eradication not confirmed who  presents for evaluation of solid food dysphagia, epigastric pain and colon cancer screening  Proceed with upper endoscopy with dilation and colonoscopy   The risks of the procedure including infection, bleed, or perforation as well as benefits, limitations, alternatives and imponderables have been reviewed with the patient. Questions have been answered. All parties agreeable.

## 2023-11-25 ENCOUNTER — Encounter (HOSPITAL_COMMUNITY): Payer: Self-pay | Admitting: Gastroenterology

## 2023-11-26 ENCOUNTER — Ambulatory Visit (INDEPENDENT_AMBULATORY_CARE_PROVIDER_SITE_OTHER): Payer: Self-pay | Admitting: Gastroenterology

## 2023-11-26 LAB — SURGICAL PATHOLOGY

## 2023-12-01 NOTE — Progress Notes (Signed)
 7 yr TCS noted in recall Patient result letter mailed Patient's PCP is on EPIC

## 2024-01-08 ENCOUNTER — Ambulatory Visit: Payer: Self-pay

## 2024-01-08 VITALS — BP 137/81 | HR 111 | Ht 68.0 in | Wt 117.0 lb

## 2024-01-08 DIAGNOSIS — J449 Chronic obstructive pulmonary disease, unspecified: Secondary | ICD-10-CM | POA: Diagnosis not present

## 2024-01-08 MED ORDER — BREZTRI AEROSPHERE 160-9-4.8 MCG/ACT IN AERO: 2.0000 | INHALATION_SPRAY | Freq: Every day | RESPIRATORY_TRACT | 3 refills | Status: AC

## 2024-01-08 MED ORDER — ALBUTEROL SULFATE HFA 108 (90 BASE) MCG/ACT IN AERS: 2.0000 | INHALATION_SPRAY | RESPIRATORY_TRACT | 2 refills | Status: AC | PRN

## 2024-01-08 NOTE — Progress Notes (Unsigned)
 "  New Patient Office Visit  Subjective    Patient ID: Robert Duarte, male    DOB: [birth year]  Age: 68 y.o. MRN: [id number]  CC:  Chief Complaint  Patient presents with   Establish Care    HPI Robert Duarte presents to establish care Discussed the use of AI scribe software for clinical note transcription with the patient, who gave verbal consent to proceed.  History of Present Illness    Robert Duarte is a 68 year old male with COPD who presents for establishment of care and medication refills.  Dyspnea and chronic obstructive pulmonary disease (copd) - Difficulty breathing occurred this morning, prompting request for respiratory medication refill. - Respiratory medication provides significant symptom relief. - Diagnosed with COPD in 2020. - Smoking exacerbated breathing issues prior to cessation. - No current tobacco use; quit smoking in 2020 after COPD diagnosis. - Previous attempt to quit smoking with Chantix was unsuccessful due to work-related stress.  Weight changes - Gained weight after smoking cessation, reaching 127 pounds. - Subsequent weight loss; current weight is 117 pounds.  Recent preventive care and laboratory evaluation - Recent colonoscopy was normal; recommended to repeat in seven years. - Blood work performed two months ago; specific results not discussed.      Outpatient Encounter Medications as of 01/08/2024  Medication Sig   acetaminophen  (TYLENOL ) 325 MG tablet Take 2 tablets (650 mg total) by mouth every 6 (six) hours as needed for fever, moderate pain, mild pain or headache.   albuterol  (PROVENTIL ) (2.5 MG/3ML) 0.083% nebulizer solution Take 3 mLs (2.5 mg total) by nebulization every 6 (six) hours as needed for wheezing or shortness of breath.   albuterol  (PROVENTIL ) 4 MG tablet Take 1 tablet by mouth 3 (three) times daily.   atorvastatin (LIPITOR) 20 MG tablet Take 20 mg by mouth daily.   ezetimibe (ZETIA) 10 MG tablet Take 10  mg by mouth daily.   pantoprazole  (PROTONIX ) 40 MG tablet Take 40 mg by mouth 2 (two) times daily.   [DISCONTINUED] albuterol  (PROVENTIL  HFA;VENTOLIN  HFA) 108 (90 Base) MCG/ACT inhaler Inhale 2 puffs into the lungs every 4 (four) hours as needed for wheezing or shortness of breath.   [DISCONTINUED] Budeson-Glycopyrrol-Formoterol  (BREZTRI  AEROSPHERE) 160-9-4.8 MCG/ACT AERO Inhale 1 Dose into the lungs daily.   albuterol  (VENTOLIN  HFA) 108 (90 Base) MCG/ACT inhaler Inhale 2 puffs into the lungs every 4 (four) hours as needed for wheezing or shortness of breath.   budesonide-glycopyrrolate-formoterol  (BREZTRI  AEROSPHERE) 160-9-4.8 MCG/ACT AERO inhaler Inhale 2 puffs into the lungs daily.   No facility-administered encounter medications on file as of 01/08/2024.    Past Medical History:  Diagnosis Date   Arthritis    CHF (congestive heart failure) (HCC)    Emphysema     Past Surgical History:  Procedure Laterality Date   COLONOSCOPY  01/21/2000   internal hemorrhoids   COLONOSCOPY N/A 11/24/2023   Procedure: COLONOSCOPY;  Surgeon: Cinderella Deatrice FALCON, MD;  Location: [facility];  Service: Endoscopy;  Laterality: N/A;  10:00am, ASA 1-2   ESOPHAGEAL DILATION N/A 11/24/2023   Procedure: DILATION, ESOPHAGUS;  Surgeon: Cinderella Deatrice FALCON, MD;  Location: [facility];  Service: Endoscopy;  Laterality: N/A;   ESOPHAGOGASTRODUODENOSCOPY N/A 11/24/2023   Procedure: EGD (ESOPHAGOGASTRODUODENOSCOPY);  Surgeon: Cinderella Deatrice FALCON, MD;  Location: [facility];  Service: Endoscopy;  Laterality: N/A;   TONSILLECTOMY     UPPER GASTROINTESTINAL ENDOSCOPY      Family History  Problem Relation Age  of Onset   Lung cancer Father        deceased   Heart disease Mother        heart valve replacement   Colon cancer Neg Hx    Liver disease Neg Hx     Social History   Socioeconomic History   Marital status: Married    Spouse name: Not on file   Number of children: Not on file   Years of education: Not  on file   Highest education level: Not on file  Occupational History   Occupation: Nutritional Therapist    Employer: [employer]    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  Tobacco Use   Smoking status: Former    Current packs/day: 0.00    Average packs/day: 1 pack/day for 47.5 years (47.5 ttl pk-yrs)    Types: Cigarettes    Start date: 03/09/1971    Quit date: 09/21/2018    Years since quitting: 5.3   Smokeless tobacco: Never  Substance and Sexual Activity   Alcohol use: No   Drug use: No   Sexual activity: Not Currently  Other Topics Concern   Not on file  Social History Narrative   Not on file   Social Drivers of Health   Tobacco Use: Medium Risk (01/08/2024)   Patient History    Smoking Tobacco Use: Former    Smokeless Tobacco Use: Never    Passive Exposure: Not on Actuary Strain: Not on file  Food Insecurity: Not on file  Transportation Needs: Not on file  Physical Activity: Not on file  Stress: Not on file  Social Connections: Not on file  Intimate Partner Violence: Not on file  Depression (PHQ2-9): Low Risk (01/08/2024)   Depression (PHQ2-9)    PHQ-2 Score: 0  Alcohol Screen: Not on file  Housing: Not on file  Utilities: Not on file  Health Literacy: Not on file   ROS     Objective    BP 137/81   Pulse (!) 111   Ht 5' 8 (1.727 m)   Wt 117 lb (53.1 kg)   SpO2 92%   BMI 17.79 kg/m   Physical Exam Vitals and nursing note reviewed.  Constitutional:      Appearance: Normal appearance.  HENT:     Head: Normocephalic.  Eyes:     Extraocular Movements: Extraocular movements intact.     Pupils: Pupils are equal, round, and reactive to light.  Cardiovascular:     Rate and Rhythm: Normal rate and regular rhythm.  Pulmonary:     Effort: Pulmonary effort is normal.     Breath sounds: Normal breath sounds.  Musculoskeletal:     Cervical back: Normal range of motion and neck supple.  Neurological:     Mental Status: He is alert and  oriented to person, place, and time.  Psychiatric:        Mood and Affect: Mood normal.        Thought Content: Thought content normal.        Assessment & Plan:   Problem List Items Addressed This Visit       Respiratory   COPD GOLD 3 / AB - Primary   COPD exacerbation post-fall, dyspnea noted. Smoking cessation in 2020 improved symptoms. - Refilled respiratory medication at [facility]. - Refilled rescue inhaler at [facility].      Relevant Medications   albuterol  (VENTOLIN  HFA) 108 (90 Base) MCG/ACT inhaler   budesonide-glycopyrrolate-formoterol  (BREZTRI  AEROSPHERE) 160-9-4.8 MCG/ACT AERO  inhaler   Return in about 6 months (around 07/08/2024) for chronic follow-up with PCP.   Leita Longs, FNP   "

## 2024-01-10 NOTE — Assessment & Plan Note (Signed)
 COPD exacerbation post-fall, dyspnea noted. Smoking cessation in 2020 improved symptoms. - Refilled respiratory medication at [facility]. - Refilled rescue inhaler at [facility].

## 2024-01-12 ENCOUNTER — Other Ambulatory Visit: Payer: Self-pay

## 2024-01-12 ENCOUNTER — Emergency Department (HOSPITAL_COMMUNITY)
Admission: EM | Admit: 2024-01-12 | Discharge: 2024-01-12 | Disposition: A | Discharge status: Home / Self Care | Attending: Emergency Medicine | Admitting: Emergency Medicine

## 2024-01-12 DIAGNOSIS — R220 Localized swelling, mass and lump, head: Secondary | ICD-10-CM | POA: Diagnosis present

## 2024-01-12 DIAGNOSIS — Z79899 Other long term (current) drug therapy: Secondary | ICD-10-CM | POA: Diagnosis not present

## 2024-01-12 DIAGNOSIS — L03211 Cellulitis of face: Secondary | ICD-10-CM | POA: Diagnosis not present

## 2024-01-12 DIAGNOSIS — R0981 Nasal congestion: Secondary | ICD-10-CM | POA: Diagnosis not present

## 2024-01-12 MED ORDER — MUPIROCIN CALCIUM 2 % EX CREA: 1.0000 | TOPICAL_CREAM | Freq: Two times a day (BID) | CUTANEOUS | 0 refills | Status: AC

## 2024-01-12 MED ORDER — DOXYCYCLINE HYCLATE 100 MG PO TABS
100.0000 mg | ORAL_TABLET | Freq: Once | ORAL | Status: AC
  Administered 2024-01-12: 100 mg via ORAL
  Filled 2024-01-12: qty 1

## 2024-01-12 MED ORDER — DOXYCYCLINE HYCLATE 100 MG PO CAPS: 100.0000 mg | ORAL_CAPSULE | Freq: Two times a day (BID) | ORAL | 0 refills | Status: AC

## 2024-01-12 NOTE — ED Triage Notes (Signed)
 Pt states he's had a sinus infection and picked a sore inside his nose now c/o pain and swelling to right nare.

## 2024-01-12 NOTE — ED Provider Notes (Signed)
 "  [facility] AT [facility]  Provider Note  CSN: [id number] Arrival date & time: 01/12/24 0201  History No chief complaint on file.   Robert Duarte is a 68 y.o. male reports he has had 2 days of nasal congestion and earlier today picked a sore in his R nostril. Now having redness and swelling to his face. No fever. No visual changes.    Home Medications Prior to Admission medications  Medication Sig Start Date End Date Taking? Authorizing Provider  doxycycline  (VIBRAMYCIN ) 100 MG capsule Take 1 capsule (100 mg total) by mouth 2 (two) times daily. 01/12/24  Yes Roselyn Carlin NOVAK, MD  mupirocin  cream (BACTROBAN ) 2 % Apply 1 Application topically 2 (two) times daily. 01/12/24  Yes Roselyn Carlin NOVAK, MD  acetaminophen  (TYLENOL ) 325 MG tablet Take 2 tablets (650 mg total) by mouth every 6 (six) hours as needed for fever, moderate pain, mild pain or headache. 10/31/21   Johnson, Clanford L, MD  albuterol  (PROVENTIL ) (2.5 MG/3ML) 0.083% nebulizer solution Take 3 mLs (2.5 mg total) by nebulization every 6 (six) hours as needed for wheezing or shortness of breath. 04/03/22   Raford Lenis, MD  albuterol  (PROVENTIL ) 4 MG tablet Take 1 tablet by mouth 3 (three) times daily. 10/28/21   [provider]  albuterol  (VENTOLIN  HFA) 108 (90 Base) MCG/ACT inhaler Inhale 2 puffs into the lungs every 4 (four) hours as needed for wheezing or shortness of breath. 01/08/24   Bevely Doffing, FNP  atorvastatin (LIPITOR) 20 MG tablet Take 20 mg by mouth daily. 07/14/23   [provider]  budesonide-glycopyrrolate-formoterol  (BREZTRI  AEROSPHERE) 160-9-4.8 MCG/ACT AERO inhaler Inhale 2 puffs into the lungs daily. 01/08/24   Bevely Doffing, FNP  ezetimibe (ZETIA) 10 MG tablet Take 10 mg by mouth daily. 09/03/22   [provider]  pantoprazole  (PROTONIX ) 40 MG tablet Take 40 mg by mouth 2 (two) times daily. 10/08/21   [provider]     Allergies     Penicillins and Yellow jacket venom [bee venom]   Review of Systems   Review of Systems Please see HPI for pertinent positives and negatives  Physical Exam BP 136/87   Pulse 72   Temp 97.9 F (36.6 C) (Oral)   Resp 15   SpO2 96%   Physical Exam Vitals and nursing note reviewed.  HENT:     Head: Normocephalic.     Nose: Nose normal.     Comments: Erythema, induration to R naris and extending to R cheek Eyes:     Extraocular Movements: Extraocular movements intact.     Pupils: Pupils are equal, round, and reactive to light.  Pulmonary:     Effort: Pulmonary effort is normal.  Musculoskeletal:        General: Normal range of motion.     Cervical back: Neck supple.  Skin:    Findings: No rash (on exposed skin).  Neurological:     Mental Status: He is alert and oriented to person, place, and time.  Psychiatric:        Mood and Affect: Mood normal.     ED Results / Procedures / Treatments   EKG None  Procedures Procedures  Medications Ordered in the ED Medications  doxycycline  (VIBRA -TABS) tablet 100 mg (has no administration in time range)    Initial Impression and Plan  Patient here with facial cellulitis from nasal sore. No signs of deep infection or cavernous sinus thrombosis. Plan oral doxycycline , topical mupirocin ,  PCP follow up, RTED for any other concerns.    ED Course       MDM Rules/Calculators/A&P Medical Decision Making Problems Addressed: Facial cellulitis: acute illness or injury  Risk Prescription drug management.     Final Clinical Impression(s) / ED Diagnoses Final diagnoses:  Facial cellulitis    Rx / DC Orders ED Discharge Orders          Ordered    doxycycline  (VIBRAMYCIN ) 100 MG capsule  2 times daily        01/12/24 0224    mupirocin  cream (BACTROBAN ) 2 %  2 times daily        01/12/24 0224             Roselyn Carlin NOVAK, MD 01/12/24 0225  "

## 2024-02-01 ENCOUNTER — Ambulatory Visit: Payer: Self-pay | Admitting: Family Medicine

## 2024-02-02 ENCOUNTER — Ambulatory Visit: Payer: Self-pay | Admitting: Family Medicine

## 2024-02-21 ENCOUNTER — Encounter (INDEPENDENT_AMBULATORY_CARE_PROVIDER_SITE_OTHER): Payer: Self-pay

## 2024-02-22 ENCOUNTER — Ambulatory Visit (INDEPENDENT_AMBULATORY_CARE_PROVIDER_SITE_OTHER): Admitting: Gastroenterology

## 2024-03-21 ENCOUNTER — Ambulatory Visit: Admitting: Internal Medicine

## 2024-04-18 ENCOUNTER — Ambulatory Visit (INDEPENDENT_AMBULATORY_CARE_PROVIDER_SITE_OTHER): Admitting: Gastroenterology

## 2024-07-08 ENCOUNTER — Ambulatory Visit: Payer: Self-pay
